# Patient Record
Sex: Male | Born: 1965 | Race: White | Hispanic: No | Marital: Married | Smoking: Never smoker
Health system: Southern US, Community
[De-identification: ages and names within clinical notes are randomized; demographics above are authoritative.]

## PROBLEM LIST (undated history)

## (undated) DIAGNOSIS — J329 Chronic sinusitis, unspecified: Secondary | ICD-10-CM

## (undated) DIAGNOSIS — R7303 Prediabetes: Secondary | ICD-10-CM

## (undated) DIAGNOSIS — M1990 Unspecified osteoarthritis, unspecified site: Secondary | ICD-10-CM

## (undated) DIAGNOSIS — M259 Joint disorder, unspecified: Secondary | ICD-10-CM

## (undated) DIAGNOSIS — G4730 Sleep apnea, unspecified: Secondary | ICD-10-CM

## (undated) DIAGNOSIS — C801 Malignant (primary) neoplasm, unspecified: Secondary | ICD-10-CM

## (undated) DIAGNOSIS — I10 Essential (primary) hypertension: Secondary | ICD-10-CM

## (undated) DIAGNOSIS — K219 Gastro-esophageal reflux disease without esophagitis: Secondary | ICD-10-CM

## (undated) DIAGNOSIS — E119 Type 2 diabetes mellitus without complications: Secondary | ICD-10-CM

## (undated) HISTORY — PX: WISDOM TOOTH EXTRACTION: SHX21

---

## 2007-07-09 ENCOUNTER — Ambulatory Visit: Admission: RE | Admit: 2007-07-09 | Discharge: 2007-07-09 | Payer: Self-pay | Admitting: Otolaryngology

## 2007-07-16 ENCOUNTER — Encounter: Admission: RE | Admit: 2007-07-16 | Discharge: 2007-07-16 | Payer: Self-pay | Admitting: Geriatric Medicine

## 2008-06-06 HISTORY — PX: BACK SURGERY: SHX140

## 2008-07-03 ENCOUNTER — Ambulatory Visit (HOSPITAL_COMMUNITY): Admission: RE | Admit: 2008-07-03 | Discharge: 2008-07-04 | Payer: Self-pay | Admitting: Neurosurgery

## 2010-11-19 NOTE — Op Note (Signed)
Darrell Whitehead, Darrell Whitehead                 ACCOUNT NO.:  [id number]   MEDICAL RECORD NO.:  [id number]          PATIENT TYPE:  OIB   LOCATION:  3528                         FACILITY:  [facility]   PHYSICIAN:  Donalee Citrin, M.D.        DATE OF BIRTH:  [birth year]   DATE OF PROCEDURE:  07/03/2008  DATE OF DISCHARGE:                               OPERATIVE REPORT   PREOPERATIVE DIAGNOSIS:  Left S1 radiculopathy from ruptured disk, L5-  S1, left.   PROCEDURES:  1. Lumbar laminectomy and microdiskectomy, L5-S1, left.  2. Microscope dissection of left S1 nerve root.  3. Microscopic diskectomy.   SURGEON:  Donalee Citrin, MD   ASSISTANT:  Reinaldo Meeker, MD   ANESTHESIA:  General endotracheal.   HISTORY OF PRESENT ILLNESS:  The patient is a very pleasant 45 year old  gentleman who has had progressive worsening back and left leg pain  radiating down to his left hip, down to back of his left leg, the  outside of his left foot with numbness, tingling in the same  distribution.  Preoperative imaging  showed a very large ruptured disk  causing severe thecal sac compression, left S1 nerve root compression.  The patient failed all forms of conservative treatment, had progressive  worsening pain and some trace weakness, some plantar flexion, was  recommended laminectomy and microdiskectomy.  Risks and benefits of the  operation were explained to the patient.  He understood and agreed to  proceed forth.   DESCRIPTION OF PROCEDURE:  The patient was brought to the OR, was  induced general anesthesia, positioned prone on the Wilson frame.  Back  was prepped in the usual sterile fashion.  Preop incision was localized  at the appropriate level at L5-S1, so after infiltration of 10 mL of  lidocaine with epi, a midline incision was made and Bovie electrocautery  was used to take down in the subperiosteum.  Dissection was carried down  in the lamina of L5 and S1 on the left.  Intraoperative x-ray confirmed  position  and appropriate level, so using a high-speed drill, the  inferior aspect of L5 medial facet complex and superior aspect of S1 was  drilled down using 2- and 3-mm Kerrison punch.  The laminotomy was begun  and the ligamentum flavum was removed in the piecemeal fashion exposing  the thecal sac and dorsal aspect of the S1 nerve root.  At this point,  operative microscope draped and brought into the field.  Under  microscopic illumination, the undersurface of the medial gutter was  underbitten to gain access to the lateral margin of the S1 nerve root.  The S1 pedicle was identified and using a 4 Penfield, the S1 nerve root  was dissected off.  A very large disk herniation partially calcified,  still contained with ligament at L5-S1.  D'Errico was used to reflect  the S1 nerve root medially.  Annulotomy was made with an 11 blade  scalpel.  Disk space was cleaned out.  Epstein curettes were used to  clean down and to remove the calcified portion, which was removed  in  piecemeal fashion with pituitary rongeurs.  At the end of diskectomy,  there was no further stenosis on the thecal sac or S1 nerve root, it was  explored with a coronary dilator, angled hockey stick and noted to be  widely patent.  The wound was then copiously irrigated.  Meticulous  hemostasis was maintained.  Gelfoam was overlaid on the top of dura.  The muscle and fascia reapproximated in layers with interrupted Vicryl  and the skin was closed with running 4-0 subcuticular.  Benzoin and  Steri-Strips were applied.  The patient went to recovery room in stable  condition.  At the end of the case, all needle count and sponge count  were correct.           ______________________________  Donalee Citrin, M.D.     GC/MEDQ  D:  07/03/2008  T:  07/04/2008  Job:  [id number]

## 2010-11-19 NOTE — Procedures (Signed)
NAMESAMIN, MILKE                 ACCOUNT NO.:  [id number]   MEDICAL RECORD NO.:  [id number]          PATIENT TYPE:  OUT   LOCATION:  SLEEP LAB                     FACILITY:  [facility]   PHYSICIAN:  Barbaraann Share, MD,FCCPDATE OF BIRTH:  01[birth year]   DATE OF STUDY:  07/09/2007                            NOCTURNAL POLYSOMNOGRAM   REFERRING PHYSICIAN:  Antony Contras, MD   INDICATION FOR STUDY:  Hypersomnia with sleep apnea.   EPWORTH SLEEPINESS SCORE:  18   SLEEP ARCHITECTURE:  The patient was found to have a total sleep time of  367 minutes, with very little slow-wave sleep and also decreased REM.  Sleep onset latency was mildly prolonged at 34 minutes, and REM latency  was normal.  Sleep efficiency was very poor during the diagnostic  portion of the study and excellent during the titration portion of the  study.   RESPIRATORY DATA:  The patient underwent a split night study, where he  was found to have 192 obstructive events in the first 107 minutes of  sleep.  This gave him an apnea/hypopnea index during the diagnostic  portion of 108 events per hour.  The events occurred in all body  positions, and there was loud snoring noted.  By protocol, the patient  was placed on a medium CPAP mask that was not recorded in the records by  the sleep technician.  CPAP was initiated at pressure of 5 centimeters  and ultimately titrated to 11 centimeters with good control of events,  even through REM.  Tolerance appeared to be excellent.   OXYGEN DATA:  The patient was found to have O2 desaturation transiently  to 80 percent with his obstructive events.   CARDIAC DATA:  No clinically significant arrhythmias were noted.   MOVEMENT-PARASOMNIA:  None.   IMPRESSIONS-RECOMMENDATIONS:  Split night study reveals very severe  obstructive sleep apnea with an apnea/hypopnea index of 108 events per  hour and O2 desaturation as low as 80% during the diagnostic portion.  The patient was placed on CPAP  with a medium-sized mask that was not  named in the records and ultimately titrated to  11 cm of water pressure, with very good response.  The patient should  also be encouraged to work aggressively on weight loss.      Barbaraann Share, MD,FCCP  Diplomate, American Board of Sleep  Medicine  Electronically Signed     [facility]/MEDQ  D:  07/22/2007 13:21:50  T:  07/22/2007 14:10:11  Job:  [id number]

## 2011-04-11 LAB — CBC
HCT: 50.5 % (ref 39.0–52.0)
Hemoglobin: 16.7 g/dL (ref 13.0–17.0)
MCHC: 33 g/dL (ref 30.0–36.0)
MCV: 91.3 fL (ref 78.0–100.0)
RBC: 5.53 MIL/uL (ref 4.22–5.81)
WBC: 12.4 10*3/uL — ABNORMAL HIGH (ref 4.0–10.5)

## 2011-07-23 ENCOUNTER — Other Ambulatory Visit: Payer: Self-pay | Admitting: Internal Medicine

## 2011-07-23 ENCOUNTER — Ambulatory Visit
Admission: RE | Admit: 2011-07-23 | Discharge: 2011-07-23 | Disposition: A | Discharge status: Home / Self Care | Payer: 59 | Source: Ambulatory Visit | Attending: Internal Medicine | Admitting: Internal Medicine

## 2011-07-23 DIAGNOSIS — M5410 Radiculopathy, site unspecified: Secondary | ICD-10-CM

## 2011-07-23 DIAGNOSIS — M545 Low back pain, unspecified: Secondary | ICD-10-CM

## 2011-07-23 MED ORDER — GADOBENATE DIMEGLUMINE 529 MG/ML IV SOLN
20.0000 mL | Freq: Once | INTRAVENOUS | Status: AC | PRN
  Administered 2011-07-23: 20 mL via INTRAVENOUS

## 2011-09-05 ENCOUNTER — Encounter (HOSPITAL_COMMUNITY): Payer: Self-pay | Admitting: Pharmacy Technician

## 2011-09-09 NOTE — Pre-Procedure Instructions (Signed)
20 Captain Blucher  09/09/2011   Your procedure is scheduled on: Wednesday September 17, 2011 at 1130 am.  Report to [facility] at 0930 AM.  Call this number if you have problems the morning of surgery: [phone]   Remember:   Do not eat food:After Midnight.  May have clear liquids: up to 4 Hours before arrival.  Clear liquids include soda, tea, black coffee, apple or grape juice, broth.  Take these medicines the morning of surgery with A SIP OF WATER: Hydrocodone if needed for pain   Do not wear jewelry, make-up or nail polish.  Do not wear lotions, powders, or perfumes. You may wear deodorant.  Do not shave 48 hours prior to surgery.  Do not bring valuables to the hospital.  Contacts, dentures or bridgework may not be worn into surgery.  Leave suitcase in the car. After surgery it may be brought to your room.  For patients admitted to the hospital, checkout time is 11:00 AM the day of discharge.   Patients discharged the day of surgery will not be allowed to drive home.  Name and phone number of your driver:   Special Instructions: CHG Shower Use Special Wash: 1/2 bottle night before surgery and 1/2 bottle morning of surgery.   Please read over the following fact sheets that you were given: Pain Booklet, Coughing and Deep Breathing and Surgical Site Infection Prevention

## 2011-09-10 ENCOUNTER — Encounter (HOSPITAL_COMMUNITY): Payer: Self-pay

## 2011-09-10 ENCOUNTER — Encounter (HOSPITAL_COMMUNITY)
Admission: RE | Admit: 2011-09-10 | Discharge: 2011-09-10 | Disposition: A | Discharge status: Home / Self Care | Payer: 59 | Source: Ambulatory Visit | Attending: Neurosurgery | Admitting: Neurosurgery

## 2011-09-10 HISTORY — DX: Joint disorder, unspecified: M25.9

## 2011-09-10 HISTORY — DX: Chronic sinusitis, unspecified: J32.9

## 2011-09-10 HISTORY — DX: Sleep apnea, unspecified: G47.30

## 2011-09-10 LAB — CBC
HCT: 43.5 % (ref 39.0–52.0)
MCH: 31.2 pg (ref 26.0–34.0)
MCHC: 35.2 g/dL (ref 30.0–36.0)
MCV: 88.6 fL (ref 78.0–100.0)
Platelets: 283 10*3/uL (ref 150–400)
RDW: 12.8 % (ref 11.5–15.5)

## 2011-09-10 NOTE — Progress Notes (Signed)
Pt denied having a Cardiologist, stress test, cardiac cath, or echocardiogram. Pt did have sleep study in 2009. Results in EPIC.

## 2011-09-16 MED ORDER — VANCOMYCIN HCL 1000 MG IV SOLR
1500.0000 mg | INTRAVENOUS | Status: AC
  Administered 2011-09-17: 1500 mg via INTRAVENOUS
  Filled 2011-09-16: qty 1500

## 2011-09-17 ENCOUNTER — Ambulatory Visit (HOSPITAL_COMMUNITY): Payer: 59 | Admitting: Anesthesiology

## 2011-09-17 ENCOUNTER — Encounter (HOSPITAL_COMMUNITY): Payer: Self-pay | Admitting: Anesthesiology

## 2011-09-17 ENCOUNTER — Encounter (HOSPITAL_COMMUNITY): Payer: Self-pay | Admitting: *Deleted

## 2011-09-17 ENCOUNTER — Ambulatory Visit (HOSPITAL_COMMUNITY)
Admission: RE | Admit: 2011-09-17 | Discharge: 2011-09-18 | Disposition: A | Discharge status: Home Health | Payer: 59 | Source: Ambulatory Visit | Attending: Neurosurgery | Admitting: Neurosurgery

## 2011-09-17 ENCOUNTER — Ambulatory Visit (HOSPITAL_COMMUNITY): Payer: 59

## 2011-09-17 ENCOUNTER — Encounter (HOSPITAL_COMMUNITY)
Admission: RE | Disposition: A | Discharge status: Home Health | Payer: Self-pay | Source: Ambulatory Visit | Attending: Neurosurgery

## 2011-09-17 DIAGNOSIS — G4730 Sleep apnea, unspecified: Secondary | ICD-10-CM | POA: Insufficient documentation

## 2011-09-17 DIAGNOSIS — M5126 Other intervertebral disc displacement, lumbar region: Secondary | ICD-10-CM | POA: Insufficient documentation

## 2011-09-17 DIAGNOSIS — Z01812 Encounter for preprocedural laboratory examination: Secondary | ICD-10-CM | POA: Insufficient documentation

## 2011-09-17 HISTORY — PX: LUMBAR LAMINECTOMY/DECOMPRESSION MICRODISCECTOMY: SHX5026

## 2011-09-17 SURGERY — LUMBAR LAMINECTOMY/DECOMPRESSION MICRODISCECTOMY 1 LEVEL
Anesthesia: General | Site: Back | Laterality: Left | Wound class: Clean

## 2011-09-17 MED ORDER — PROPOFOL 10 MG/ML IV EMUL
INTRAVENOUS | Status: DC | PRN
  Administered 2011-09-17: 30 mg via INTRAVENOUS
  Administered 2011-09-17: 200 mg via INTRAVENOUS

## 2011-09-17 MED ORDER — HYDROMORPHONE HCL PF 1 MG/ML IJ SOLN: 0.2500 mg | INTRAMUSCULAR | Status: DC | PRN

## 2011-09-17 MED ORDER — CYCLOBENZAPRINE HCL 10 MG PO TABS: 10.0000 mg | ORAL_TABLET | Freq: Three times a day (TID) | ORAL | Status: DC | PRN

## 2011-09-17 MED ORDER — BUPIVACAINE HCL (PF) 0.25 % IJ SOLN
INTRAMUSCULAR | Status: DC | PRN
  Administered 2011-09-17: 18 mL

## 2011-09-17 MED ORDER — LORATADINE 10 MG PO TABS
10.0000 mg | ORAL_TABLET | Freq: Every day | ORAL | Status: DC
  Administered 2011-09-17 – 2011-09-18 (×2): 10 mg via ORAL
  Filled 2011-09-17 (×2): qty 1

## 2011-09-17 MED ORDER — ONDANSETRON HCL 4 MG/2ML IJ SOLN: 4.0000 mg | Freq: Once | INTRAMUSCULAR | Status: DC | PRN

## 2011-09-17 MED ORDER — ROCURONIUM BROMIDE 100 MG/10ML IV SOLN
INTRAVENOUS | Status: DC | PRN
  Administered 2011-09-17: 10 mg via INTRAVENOUS
  Administered 2011-09-17: 50 mg via INTRAVENOUS

## 2011-09-17 MED ORDER — LIDOCAINE HCL (CARDIAC) 20 MG/ML IV SOLN
INTRAVENOUS | Status: DC | PRN
  Administered 2011-09-17: 60 mg via INTRAVENOUS

## 2011-09-17 MED ORDER — NEOSTIGMINE METHYLSULFATE 1 MG/ML IJ SOLN
INTRAMUSCULAR | Status: DC | PRN
  Administered 2011-09-17: 5 mg via INTRAVENOUS

## 2011-09-17 MED ORDER — SODIUM CHLORIDE 0.9 % IV SOLN
INTRAVENOUS | Status: AC
  Filled 2011-09-17: qty 500

## 2011-09-17 MED ORDER — MENTHOL 3 MG MT LOZG: 1.0000 | LOZENGE | OROMUCOSAL | Status: DC | PRN

## 2011-09-17 MED ORDER — KETOROLAC TROMETHAMINE 30 MG/ML IJ SOLN
INTRAMUSCULAR | Status: DC | PRN
  Administered 2011-09-17: 30 mg via INTRAVENOUS

## 2011-09-17 MED ORDER — ONDANSETRON HCL 4 MG/2ML IJ SOLN: 4.0000 mg | INTRAMUSCULAR | Status: DC | PRN

## 2011-09-17 MED ORDER — SUCCINYLCHOLINE CHLORIDE 20 MG/ML IJ SOLN
INTRAMUSCULAR | Status: DC | PRN
  Administered 2011-09-17: 200 mg via INTRAVENOUS

## 2011-09-17 MED ORDER — DIPHENHYDRAMINE-APAP (SLEEP) 25-500 MG PO TABS: 1.0000 | ORAL_TABLET | Freq: Every evening | ORAL | Status: DC | PRN

## 2011-09-17 MED ORDER — LIDOCAINE-EPINEPHRINE 1 %-1:100000 IJ SOLN
INTRAMUSCULAR | Status: DC | PRN
  Administered 2011-09-17: 10 mL via INTRADERMAL

## 2011-09-17 MED ORDER — SODIUM CHLORIDE 0.9 % IR SOLN
Status: DC | PRN
  Administered 2011-09-17: 13:00:00

## 2011-09-17 MED ORDER — DIPHENHYDRAMINE HCL 25 MG PO CAPS: 25.0000 mg | ORAL_CAPSULE | Freq: Every evening | ORAL | Status: DC | PRN

## 2011-09-17 MED ORDER — BACITRACIN 50000 UNITS IM SOLR
INTRAMUSCULAR | Status: AC
  Filled 2011-09-17: qty 1

## 2011-09-17 MED ORDER — ONDANSETRON HCL 4 MG/2ML IJ SOLN
INTRAMUSCULAR | Status: DC | PRN
  Administered 2011-09-17: 4 mg via INTRAVENOUS

## 2011-09-17 MED ORDER — DEXAMETHASONE SODIUM PHOSPHATE 10 MG/ML IJ SOLN
INTRAMUSCULAR | Status: AC
  Administered 2011-09-17: 10 mg via INTRAVENOUS
  Filled 2011-09-17: qty 1

## 2011-09-17 MED ORDER — DEXAMETHASONE SODIUM PHOSPHATE 10 MG/ML IJ SOLN: 10.0000 mg | Freq: Once | INTRAMUSCULAR | Status: DC

## 2011-09-17 MED ORDER — HYDROCODONE-ACETAMINOPHEN 10-325 MG PO TABS
1.0000 | ORAL_TABLET | Freq: Two times a day (BID) | ORAL | Status: DC
  Administered 2011-09-17 – 2011-09-18 (×2): 1 via ORAL
  Filled 2011-09-17 (×2): qty 1

## 2011-09-17 MED ORDER — ACETAMINOPHEN 650 MG RE SUPP: 650.0000 mg | RECTAL | Status: DC | PRN

## 2011-09-17 MED ORDER — PHENOL 1.4 % MT LIQD: 1.0000 | OROMUCOSAL | Status: DC | PRN

## 2011-09-17 MED ORDER — FENTANYL CITRATE 0.05 MG/ML IJ SOLN
INTRAMUSCULAR | Status: DC | PRN
  Administered 2011-09-17: 100 ug via INTRAVENOUS
  Administered 2011-09-17: 50 ug via INTRAVENOUS
  Administered 2011-09-17: 150 ug via INTRAVENOUS
  Administered 2011-09-17: 50 ug via INTRAVENOUS

## 2011-09-17 MED ORDER — ALUM & MAG HYDROXIDE-SIMETH 200-200-20 MG/5ML PO SUSP: 30.0000 mL | Freq: Four times a day (QID) | ORAL | Status: DC | PRN

## 2011-09-17 MED ORDER — OXYCODONE-ACETAMINOPHEN 5-325 MG PO TABS: 1.0000 | ORAL_TABLET | ORAL | Status: DC | PRN

## 2011-09-17 MED ORDER — SODIUM CHLORIDE 0.9 % IV SOLN
INTRAVENOUS | Status: DC | PRN
  Administered 2011-09-17: 12:00:00 via INTRAVENOUS

## 2011-09-17 MED ORDER — LACTATED RINGERS IV SOLN
INTRAVENOUS | Status: DC | PRN
  Administered 2011-09-17 (×2): via INTRAVENOUS

## 2011-09-17 MED ORDER — ACETAMINOPHEN 500 MG PO TABS: 500.0000 mg | ORAL_TABLET | Freq: Every evening | ORAL | Status: DC | PRN

## 2011-09-17 MED ORDER — HYDROMORPHONE HCL PF 1 MG/ML IJ SOLN: 0.5000 mg | INTRAMUSCULAR | Status: DC | PRN

## 2011-09-17 MED ORDER — VANCOMYCIN HCL IN DEXTROSE 1-5 GM/200ML-% IV SOLN
1000.0000 mg | Freq: Once | INTRAVENOUS | Status: AC
  Administered 2011-09-17: 1000 mg via INTRAVENOUS
  Filled 2011-09-17: qty 200

## 2011-09-17 MED ORDER — ACETAMINOPHEN 325 MG PO TABS: 650.0000 mg | ORAL_TABLET | ORAL | Status: DC | PRN

## 2011-09-17 MED ORDER — 0.9 % SODIUM CHLORIDE (POUR BTL) OPTIME
TOPICAL | Status: DC | PRN
  Administered 2011-09-17: 1000 mL

## 2011-09-17 MED ORDER — SODIUM CHLORIDE 0.9 % IJ SOLN
3.0000 mL | Freq: Two times a day (BID) | INTRAMUSCULAR | Status: DC
  Administered 2011-09-17: 3 mL via INTRAVENOUS

## 2011-09-17 MED ORDER — THROMBIN 5000 UNITS EX SOLR
CUTANEOUS | Status: DC | PRN
  Administered 2011-09-17 (×2): 5000 [IU] via TOPICAL

## 2011-09-17 MED ORDER — CEFAZOLIN SODIUM 1-5 GM-% IV SOLN: 1.0000 g | Freq: Three times a day (TID) | INTRAVENOUS | Status: DC

## 2011-09-17 MED ORDER — MIDAZOLAM HCL 5 MG/5ML IJ SOLN
INTRAMUSCULAR | Status: DC | PRN
  Administered 2011-09-17: 2 mg via INTRAVENOUS

## 2011-09-17 MED ORDER — HEMOSTATIC AGENTS (NO CHARGE) OPTIME
TOPICAL | Status: DC | PRN
  Administered 2011-09-17: 1 via TOPICAL

## 2011-09-17 MED ORDER — GLYCOPYRROLATE 0.2 MG/ML IJ SOLN
INTRAMUSCULAR | Status: DC | PRN
  Administered 2011-09-17: .8 mg via INTRAVENOUS

## 2011-09-17 SURGICAL SUPPLY — 58 items
ADH SKN CLS APL DERMABOND .7 (GAUZE/BANDAGES/DRESSINGS) ×1
APL SKNCLS STERI-STRIP NONHPOA (GAUZE/BANDAGES/DRESSINGS) ×1
BAG DECANTER FOR FLEXI CONT (MISCELLANEOUS) ×2 IMPLANT
BENZOIN TINCTURE PRP APPL 2/3 (GAUZE/BANDAGES/DRESSINGS) ×2 IMPLANT
BLADE SURG 11 STRL SS (BLADE) ×2 IMPLANT
BLADE SURG ROTATE 9660 (MISCELLANEOUS) IMPLANT
BRUSH SCRUB EZ PLAIN DRY (MISCELLANEOUS) ×2 IMPLANT
BUR MATCHSTICK NEURO 3.0 LAGG (BURR) ×2 IMPLANT
BUR PRECISION FLUTE 6.0 (BURR) ×2 IMPLANT
CANISTER SUCTION 2500CC (MISCELLANEOUS) ×2 IMPLANT
CLOTH BEACON ORANGE TIMEOUT ST (SAFETY) ×2 IMPLANT
CONT SPEC 4OZ CLIKSEAL STRL BL (MISCELLANEOUS) ×2 IMPLANT
DECANTER SPIKE VIAL GLASS SM (MISCELLANEOUS) ×2 IMPLANT
DERMABOND ADVANCED (GAUZE/BANDAGES/DRESSINGS) ×1
DERMABOND ADVANCED .7 DNX12 (GAUZE/BANDAGES/DRESSINGS) ×1 IMPLANT
DRAPE LAPAROTOMY 100X72X124 (DRAPES) ×2 IMPLANT
DRAPE MICROSCOPE LEICA (MISCELLANEOUS) ×1 IMPLANT
DRAPE POUCH INSTRU U-SHP 10X18 (DRAPES) ×2 IMPLANT
DRAPE PROXIMA HALF (DRAPES) IMPLANT
DRAPE SURG 17X23 STRL (DRAPES) ×2 IMPLANT
DRSG OPSITE 4X5.5 SM (GAUZE/BANDAGES/DRESSINGS) ×1 IMPLANT
ELECT REM PT RETURN 9FT ADLT (ELECTROSURGICAL) ×2
ELECTRODE REM PT RTRN 9FT ADLT (ELECTROSURGICAL) ×1 IMPLANT
GAUZE SPONGE 4X4 16PLY XRAY LF (GAUZE/BANDAGES/DRESSINGS) IMPLANT
GLOVE BIO SURGEON STRL SZ8 (GLOVE) ×2 IMPLANT
GLOVE BIOGEL PI IND STRL 7.5 (GLOVE) IMPLANT
GLOVE BIOGEL PI IND STRL 8.5 (GLOVE) IMPLANT
GLOVE BIOGEL PI INDICATOR 7.5 (GLOVE) ×1
GLOVE BIOGEL PI INDICATOR 8.5 (GLOVE) ×2
GLOVE ECLIPSE 7.5 STRL STRAW (GLOVE) ×2 IMPLANT
GLOVE EXAM NITRILE LRG STRL (GLOVE) IMPLANT
GLOVE EXAM NITRILE MD LF STRL (GLOVE) IMPLANT
GLOVE EXAM NITRILE XL STR (GLOVE) IMPLANT
GLOVE EXAM NITRILE XS STR PU (GLOVE) IMPLANT
GLOVE INDICATOR 8.5 STRL (GLOVE) ×2 IMPLANT
GLOVE SURG SS PI 8.0 STRL IVOR (GLOVE) ×3 IMPLANT
GOWN BRE IMP SLV AUR LG STRL (GOWN DISPOSABLE) ×1 IMPLANT
GOWN BRE IMP SLV AUR XL STRL (GOWN DISPOSABLE) ×6 IMPLANT
GOWN STRL REIN 2XL LVL4 (GOWN DISPOSABLE) ×2 IMPLANT
KIT BASIN OR (CUSTOM PROCEDURE TRAY) ×2 IMPLANT
KIT ROOM TURNOVER OR (KITS) ×2 IMPLANT
NDL SPNL 22GX3.5 QUINCKE BK (NEEDLE) ×1 IMPLANT
NEEDLE HYPO 22GX1.5 SAFETY (NEEDLE) ×2 IMPLANT
NEEDLE SPNL 22GX3.5 QUINCKE BK (NEEDLE) IMPLANT
NS IRRIG 1000ML POUR BTL (IV SOLUTION) ×2 IMPLANT
PACK LAMINECTOMY NEURO (CUSTOM PROCEDURE TRAY) ×2 IMPLANT
RUBBERBAND STERILE (MISCELLANEOUS) ×4 IMPLANT
SPONGE GAUZE 4X4 12PLY (GAUZE/BANDAGES/DRESSINGS) ×2 IMPLANT
SPONGE SURGIFOAM ABS GEL SZ50 (HEMOSTASIS) ×2 IMPLANT
STRIP CLOSURE SKIN 1/2X4 (GAUZE/BANDAGES/DRESSINGS) ×2 IMPLANT
SUT VIC AB 0 CT1 18XCR BRD8 (SUTURE) ×1 IMPLANT
SUT VIC AB 0 CT1 8-18 (SUTURE) ×2
SUT VIC AB 2-0 CT1 18 (SUTURE) ×2 IMPLANT
SUT VICRYL 4-0 PS2 18IN ABS (SUTURE) ×2 IMPLANT
SYR 20ML ECCENTRIC (SYRINGE) ×2 IMPLANT
TOWEL OR 17X24 6PK STRL BLUE (TOWEL DISPOSABLE) ×2 IMPLANT
TOWEL OR 17X26 10 PK STRL BLUE (TOWEL DISPOSABLE) ×2 IMPLANT
WATER STERILE IRR 1000ML POUR (IV SOLUTION) ×2 IMPLANT

## 2011-09-17 NOTE — Transfer of Care (Signed)
Immediate Anesthesia Transfer of Care Note  Patient: Darrell Whitehead  Procedure(s) Performed: Procedure(s) (LRB): LUMBAR LAMINECTOMY/DECOMPRESSION MICRODISCECTOMY 1 LEVEL (Left)  Patient Location: PACU  Anesthesia Type: General  Level of Consciousness: awake, alert  and oriented  Airway & Oxygen Therapy: Patient Spontanous Breathing and Patient connected to nasal cannula oxygen  Post-op Assessment: Report given to PACU RN, Post -op Vital signs reviewed and stable and Patient moving all extremities  Post vital signs: Reviewed and stable  Complications: No apparent anesthesia complications

## 2011-09-17 NOTE — Preoperative (Signed)
Beta Blockers   Reason not to administer Beta Blockers:Not Applicable 

## 2011-09-17 NOTE — Anesthesia Postprocedure Evaluation (Signed)
  Anesthesia Post-op Note  Patient: Darrell Whitehead  Procedure(s) Performed: Procedure(s) (LRB): LUMBAR LAMINECTOMY/DECOMPRESSION MICRODISCECTOMY 1 LEVEL (Left)  Patient Location: PACU  Anesthesia Type: General  Level of Consciousness: awake, alert  and oriented  Airway and Oxygen Therapy: Patient Spontanous Breathing and Patient connected to nasal cannula oxygen  Post-op Pain: mild  Post-op Assessment: Post-op Vital signs reviewed and Patient's Cardiovascular Status Stable  Post-op Vital Signs: stable  Complications: No apparent anesthesia complications

## 2011-09-17 NOTE — Anesthesia Preprocedure Evaluation (Addendum)
Anesthesia Evaluation  Patient identified by MRN, date of birth, ID band Patient awake and Patient confused    Reviewed: Allergy & Precautions, H&P , NPO status , Patient's Chart, lab work & pertinent test results  Airway Mallampati: I TM Distance: >3 FB Neck ROM: Full    Dental  (+) Teeth Intact   Pulmonary  breath sounds clear to auscultation        Cardiovascular Rhythm:Regular Rate:Normal     Neuro/Psych    GI/Hepatic   Endo/Other    Renal/GU      Musculoskeletal   Abdominal (+) + obese,  Abdomen: soft.    Peds  Hematology   Anesthesia Other Findings   Reproductive/Obstetrics                           Anesthesia Physical Anesthesia Plan  ASA: III  Anesthesia Plan: General   Post-op Pain Management:    Induction: Intravenous  Airway Management Planned: Oral ETT  Additional Equipment:   Intra-op Plan:   Post-operative Plan: Extubation in OR  Informed Consent: I have reviewed the patients History and Physical, chart, labs and discussed the procedure including the risks, benefits and alternatives for the proposed anesthesia with the patient or authorized representative who has indicated his/her understanding and acceptance.     Plan Discussed with:   Anesthesia Plan Comments: (Obesity Sleep apnea well controlled with CPAP HNP L5-S1  Plan GA with CPAP post-op  Kipp Brood MD)       Anesthesia Quick Evaluation

## 2011-09-17 NOTE — H&P (Signed)
Darrell Whitehead is an 46 y.o. male.   Chief Complaint: Back and left leg pain HPI: Patient was admitted and underwent previous laminectomy and discectomy many years ago who back in December had recurrence of his left leg pain and rays the back of his hamstrings and his calf into the bottom stenosis with numbness and tingling in the same distribution. Patient denies any right leg symptoms denies any bowel bladder complaints this is been refractory anti-inflammatories and exercise imaging revealed a large recurrent disc herniation L5-S1 left causing severe stenosis on thecal sac and left S1 nerve root. To this mixture treatment progression of clinical syndrome this was recommended laminectomy microscope was minutes of the operation with him he understands and agrees to proceed forward.  Past Medical History  Diagnosis Date  . Sleep apnea     C Pap at night  . Sinus infection     hx of  . Ankle disorder     Right ankle; wore cast; no surgery    Past Surgical History  Procedure Date  . Back surgery dec 2009    Family History  Problem Relation Age of Onset  . Anesthesia problems Neg Hx   . Hypotension Neg Hx   . Malignant hyperthermia Neg Hx   . Pseudochol deficiency Neg Hx    Social History:  reports that he has never smoked. He does not have any smokeless tobacco history on file. He reports that he drinks alcohol. He reports that he does not use illicit drugs.  Allergies:  Allergies  Allergen Reactions  . Penicillins Hives  . Tramadol Itching    Medications Prior to Admission  Medication Dose Route Frequency Provider Last Rate Last Dose  . dexamethasone (DECADRON) 10 MG/ML injection           . dexamethasone (DECADRON) injection 10 mg  10 mg Intravenous Once Mariam Dollar, MD      . HYDROmorphone (DILAUDID) injection 0.25-0.5 mg  0.25-0.5 mg Intravenous Q5 min PRN Kipp Brood, MD      . ondansetron [facility]) injection 4 mg  4 mg Intravenous Once PRN Kipp Brood, MD      . vancomycin  (VANCOCIN) 1,500 mg in sodium chloride 0.9 % 500 mL IVPB  1,500 mg Intravenous 60 min Pre-Op Mariam Dollar, MD       Medications Prior to Admission  Medication Sig Dispense Refill  . fexofenadine-pseudoephedrine (ALLEGRA-D 24) 180-240 MG per 24 hr tablet Take 1 tablet by mouth daily as needed.        No results found for this or any previous visit (from the past 48 hour(s)). No results found.  Review of Systems  Constitutional: Negative.   HENT: Positive for neck pain.   Eyes: Negative.   Respiratory: Negative.   Cardiovascular: Negative.   Gastrointestinal: Negative.   Genitourinary: Negative.   Musculoskeletal: Positive for back pain.  Skin: Negative.   Neurological: Positive for tingling.    Blood pressure 117/84, pulse 58, temperature 98.3 F (36.8 C), temperature source Oral, resp. rate 18, SpO2 98.00%. Physical Exam  Constitutional: He is oriented to person, place, and time. He appears well-developed.  HENT:  Head: Normocephalic.  Eyes: Pupils are equal, round, and reactive to light.  Respiratory: Breath sounds normal.  GI: Soft.  Neurological: He is alert and oriented to person, place, and time. He has normal strength. GCS eye subscore is 4. GCS verbal subscore is 5. GCS motor subscore is 6.  Reflex Scores:  Patellar reflexes are 1+ on the right side and 1+ on the left side.      Achilles reflexes are 1+ on the right side and 1+ on the left side.      Strength is 5 out of 5 in his iliopsoas, quads, and she's, gastrocs, EHL and anterior tibialis.     Assessment/Plan 45 and presents for redo left L5-S1 Limited microdiscectomy risks benefits of the operation were cemented patient as well as the perioperative course and expectations of outcome alternatives of surgery he understands and agrees to proceed forward.  Gayna Braddy P 09/17/2011, 11:46 AM

## 2011-09-17 NOTE — Op Note (Signed)
Preoperative diagnosis: Recurrent lumbar disc L5-S1 left the left S1 radiculopathy  Postoperative diagnosis: Same  Procedure: Redo lumbar laminectomy discectomy L5-S1 left with microdissection of left S1 nerve root microscopic discectomy  Surgeon: Jillyn Hidden Woodfin Kiss  Assistant: Shirlean Kelly  Anesthesia: Gen.  EBL: Minimal  History of present illness: Patient is very pleasant 46 year old gentleman presents with recurrent left S1 radiculopathy from her current rupture disc L5-S1 left. Patient had undergone a little energy microdiscectomy years ago initially did very well however starting in December start him recurrent left buttock and leg pain rate down the back of his leg to the outside bottom of his left foot. Workup revealed recurrent disc herniation L5-S1 left he failed all forms of conservative was recommended redo laminectomy microdiscectomy with her as well as of the operation as well as a perioperative course alternatives surgery and expectations of outcome he agreed to proceed forward.  Operative procedure: Patient brought into the or was induced under general anesthesia and positioned prone on the Wilson frame his back was prepped and draped in routine sterile fashion. His old incision was opened up and the scar tissues dissected free exposing the residual laminotomies at L5-S1. Interoperative X. identify the correct level then using a Penfield the scar tissues dissected off of the inferior aspect laminotomy defect and L5 medial facet complex  and superior aspect of the laminotomy defect at S1. Then using a 2 and 3 mm in punch laminotomy was extended further and L5 further laterally and further caudally to S1. The S1 pedicle was identified and marching from the S1 pedicle cephalad, scar tissue was dissected off of the large recurrent disc herniation still contained within scar and ligament. In addition there was a fairly sizable osteophyte coming off the medial facet complex displacing the  proximal and lateral S1 nerve root dorsally is all dissected off the S1 nerve root and removed in piecemeal fashion. Using a tree to 3 superficially retract the thecal sac working underneath the thecal sac and underneath the S1 nerve root this recurrent disc herniation was further dissected free and annulotomy was made again and the L5-S1 disc space and pituitary rongeurs used to go into the disc space. The nerve hook feeling up underneath the S1 nerve root on the medial thecal sac several large free (for medial express removed. It was partially calcified dislocation at the inferior endplate of L5 was bitten away with a safari movers and downgoing curettes. This space was further entered and cleaned out with Epstein curettes and pituitary rongeurs the undersurface of the thecal sac from the level of the interest of the L5 pedicle down the suppressant S1 pedicle results poor to confirm removal of all compressed fragments. The S1 foramen was explored with a coronary.her and hockey-stick and noted be widely patent the undersurface of thecal sac was explored with I nerve hook a 4 Penfield and also noted the decompressed. The wounds and to proceed her get meticulous hemostasis was maintained Gelfoam was laid over the dura the muscle fascia proximal in layers with interrupted Vicryl and the skin was closed running 4 subcuticular and Steri-Strips applied patient recovered in stable condition. At the end of case on sponge counts were correct.

## 2011-09-17 NOTE — Anesthesia Procedure Notes (Signed)
Procedure Name: Intubation Date/Time: 09/17/2011 12:21 PM Performed by: Julianne Rice K Pre-anesthesia Checklist: Emergency Drugs available, Patient identified, Timeout performed, Suction available and Patient being monitored Patient Re-evaluated:Patient Re-evaluated prior to inductionOxygen Delivery Method: Circle system utilized Preoxygenation: Pre-oxygenation with 100% oxygen Intubation Type: IV induction Ventilation: Mask ventilation without difficulty Laryngoscope Size: Mac and 4 Grade View: Grade II Tube type: Oral Number of attempts: 1 Airway Equipment and Method: Patient positioned with wedge pillow Placement Confirmation: ETT inserted through vocal cords under direct vision,  positive ETCO2 and breath sounds checked- equal and bilateral Secured at: 25 cm Tube secured with: Tape Dental Injury: Teeth and Oropharynx as per pre-operative assessment

## 2011-09-18 NOTE — Discharge Summary (Signed)
  Physician Discharge Summary  Patient ID: Darrell Whitehead MRN: [id number] DOB/AGE: 48-Sep-1967 48 y.o.  Admit date: 09/17/2011 Discharge date: 09/18/2011  Admission Diagnoses: Recurrent ruptured disc L4 L5-S1 left  Discharge Diagnoses: Recurrent ruptured disc L5-S1 left Active Problems:  * No active hospital problems. *    Discharged Condition: good  Hospital Course: Patient is good hospital underwent a redo laminectomy discectomy postoperatively well with recovered before the fluoroscope was in well and there were spontaneous he tolerated I will is clean and dry patient be discharged home.  Consults: Significant Diagnostic Studies: Treatments: Redo L5-S1 laminectomy discectomy left Discharge Exam: Blood pressure 129/70, pulse 80, temperature 98 F (36.7 C), temperature source Oral, resp. rate 20, SpO2 98.00%. Out of 5 wound clean and dry  Disposition: Home   Medication List  As of 09/18/2011  9:49 AM   TAKE these medications         diphenhydramine-acetaminophen 25-500 MG Tabs   Commonly known as: TYLENOL PM   Take 1 tablet by mouth at bedtime as needed. For sleep      fexofenadine-pseudoephedrine 180-240 MG per 24 hr tablet   Commonly known as: ALLEGRA-D 24   Take 1 tablet by mouth daily as needed.      HYDROcodone-acetaminophen 10-325 MG per tablet   Commonly known as: NORCO   Take 1 tablet by mouth 2 (two) times daily.      RASPBERRY PO   Take 1 capsule by mouth daily.           Follow-up Information    Follow up in 1 week.         Signed: Redina Zeller P 09/18/2011, 9:49 AM

## 2011-09-18 NOTE — Progress Notes (Signed)
Subjective: Patient reports Patient is a very well no leg pain some back soreness but well-controlled  Objective: Vital signs in last 24 hours: Temp:  [97.5 F (36.4 C)-98.3 F (36.8 C)] 98 F (36.7 C) (03/14 0800) Pulse Rate:  [80-102] 80  (03/14 0800) Resp:  [16-20] 20  (03/14 0800) BP: (117-153)/(64-74) 129/70 mmHg (03/14 0800) SpO2:  [93 %-98 %] 98 % (03/14 0800)  Intake/Output from previous day: 03/13 0701 - 03/14 0700 In: 2640 [P.O.:840; I.V.:1800] Out: 50 [Blood:50] Intake/Output this shift: Total I/O In: 480 [P.O.:480] Out: -   Strength 5 out of 5 wound clean and dry  Lab Results: No results found for this basename: WBC:2,HGB:2,HCT:2,PLT:2 in the last 72 hours BMET No results found for this basename: NA:2,K:2,CL:2,CO2:2,GLUCOSE:2,BUN:2,CREATININE:2,CALCIUM:2 in the last 72 hours  Studies/Results: Dg Lumbar Spine 1 View  09/17/2011  *RADIOLOGY REPORT*  Clinical Data: Left L5-S1 discectomy.  OPERATIVE LUMBAR SPINE - 1 VIEW 09/17/2011:  Comparison: MRI lumbar spine 07/23/2011 [facility] and lumbar spine x-rays 07/10/2011 [facility].  Findings: The same numbering scheme will be utilized as on the prior examinations, with L5 representing the last lumbar segment. Single image obtained at 1255 hours and submitted for interpretation post-operatively demonstrates the localizer probe directed toward the L5-S1 disc space.  IMPRESSION: L5-S1 localized intraoperatively.  Original Report Authenticated By: Arnell Sieving, M.D.    Assessment/Plan: Postoperative day 1 laminectomy going home  LOS: 1 day     Dorthey Depace P 09/18/2011, 9:48 AM

## 2011-09-18 NOTE — Discharge Instructions (Signed)
No lifting no bending no twisting no driving a riding a car. Wound Care Keep incision covered and dry for one week.  If you shower prior to then, cover incision with plastic wrap.  You may remove outer bandage after one week and shower.  Do not put any creams, lotions, or ointments on incision. Leave steri-strips on neck.  They will fall off by themselves. Activity Walk each and every day, increasing distance each day. No lifting greater than 5 lbs.  Avoid bending, arching, or twisting. No driving for 2 weeks; may ride as a passenger locally. If provided with back brace, wear when out of bed.  It is not necessary to wear in bed. Diet Resume your normal diet.  Return to Work Will be discussed at you follow up appointment. Call Your Doctor If Any of These Occur Redness, drainage, or swelling at the wound.  Temperature greater than 101 degrees. Severe pain not relieved by pain medication. Incision starts to come apart. Follow Up Appt Call today for appointment in 1-2 weeks ([phone]) or for problems.  If you have any hardware placed in your spine, you will need an x-ray before your appointment.

## 2011-09-22 ENCOUNTER — Encounter (HOSPITAL_COMMUNITY): Payer: Self-pay | Admitting: Neurosurgery

## 2015-06-13 ENCOUNTER — Other Ambulatory Visit: Payer: Self-pay | Admitting: Gastroenterology

## 2015-08-17 ENCOUNTER — Encounter (HOSPITAL_COMMUNITY): Payer: Self-pay | Admitting: *Deleted

## 2015-08-17 NOTE — Progress Notes (Signed)
08-17-15 1145 Spoke with pt per phone 08-16-15 states, "changed procedure to March 2017", reminded him to make sure the office is aware.

## 2015-08-27 ENCOUNTER — Encounter (HOSPITAL_COMMUNITY): Payer: Self-pay

## 2015-08-27 ENCOUNTER — Encounter (HOSPITAL_COMMUNITY)
Admission: RE | Disposition: A | Discharge status: Home / Self Care | Payer: Self-pay | Source: Ambulatory Visit | Attending: Gastroenterology

## 2015-08-27 ENCOUNTER — Ambulatory Visit (HOSPITAL_COMMUNITY)
Admission: RE | Admit: 2015-08-27 | Discharge: 2015-08-27 | Disposition: A | Discharge status: Home / Self Care | Payer: 59 | Source: Ambulatory Visit | Attending: Gastroenterology | Admitting: Gastroenterology

## 2015-08-27 ENCOUNTER — Ambulatory Visit (HOSPITAL_COMMUNITY): Payer: 59 | Admitting: Certified Registered"

## 2015-08-27 DIAGNOSIS — E119 Type 2 diabetes mellitus without complications: Secondary | ICD-10-CM | POA: Diagnosis not present

## 2015-08-27 DIAGNOSIS — Z1211 Encounter for screening for malignant neoplasm of colon: Secondary | ICD-10-CM | POA: Diagnosis not present

## 2015-08-27 DIAGNOSIS — G4733 Obstructive sleep apnea (adult) (pediatric): Secondary | ICD-10-CM | POA: Insufficient documentation

## 2015-08-27 DIAGNOSIS — G4730 Sleep apnea, unspecified: Secondary | ICD-10-CM | POA: Insufficient documentation

## 2015-08-27 DIAGNOSIS — Z6841 Body Mass Index (BMI) 40.0 and over, adult: Secondary | ICD-10-CM | POA: Insufficient documentation

## 2015-08-27 HISTORY — DX: Type 2 diabetes mellitus without complications: E11.9

## 2015-08-27 HISTORY — PX: COLONOSCOPY WITH PROPOFOL: SHX5780

## 2015-08-27 LAB — GLUCOSE, CAPILLARY: GLUCOSE-CAPILLARY: 126 mg/dL — AB (ref 65–99)

## 2015-08-27 SURGERY — COLONOSCOPY WITH PROPOFOL
Anesthesia: Monitor Anesthesia Care

## 2015-08-27 MED ORDER — SODIUM CHLORIDE 0.9 % IV SOLN: INTRAVENOUS | Status: DC

## 2015-08-27 MED ORDER — LIDOCAINE HCL (CARDIAC) 20 MG/ML IV SOLN
INTRAVENOUS | Status: DC | PRN
  Administered 2015-08-27: 60 mg via INTRAVENOUS

## 2015-08-27 MED ORDER — PROPOFOL 10 MG/ML IV BOLUS
INTRAVENOUS | Status: DC | PRN
  Administered 2015-08-27: 80 mg via INTRAVENOUS

## 2015-08-27 MED ORDER — MIDAZOLAM HCL 5 MG/5ML IJ SOLN
INTRAMUSCULAR | Status: DC | PRN
  Administered 2015-08-27: 2 mg via INTRAVENOUS

## 2015-08-27 MED ORDER — PROPOFOL 500 MG/50ML IV EMUL
INTRAVENOUS | Status: DC | PRN
  Administered 2015-08-27: 200 ug/kg/min via INTRAVENOUS

## 2015-08-27 MED ORDER — LACTATED RINGERS IV SOLN
INTRAVENOUS | Status: DC
  Administered 2015-08-27: 1000 mL via INTRAVENOUS

## 2015-08-27 MED ORDER — PROPOFOL 10 MG/ML IV BOLUS
INTRAVENOUS | Status: AC
  Filled 2015-08-27: qty 60

## 2015-08-27 MED ORDER — MIDAZOLAM HCL 2 MG/2ML IJ SOLN
INTRAMUSCULAR | Status: AC
  Filled 2015-08-27: qty 2

## 2015-08-27 MED ORDER — LIDOCAINE HCL (CARDIAC) 20 MG/ML IV SOLN
INTRAVENOUS | Status: AC
  Filled 2015-08-27: qty 5

## 2015-08-27 SURGICAL SUPPLY — 22 items

## 2015-08-27 NOTE — Op Note (Signed)
Procedure: Baseline screening colonoscopy  Endoscopist: Danise Edge  Premedication: Propofol administered by anesthesia  Procedure: The patient was placed in the left lateral decubitus position. Anal inspection and digital rectal exam were normal. Pentax pediatric colonoscope was introduced into the rectum and advanced to the cecum. A normal-appearing appendiceal orifice was identified. A normal-appearing ileocecal valve was intubated and the terminal ileum inspected. Colonic preparation for the exam today was good. Withdrawal time was 14 minutes  Rectum. Normal. Retroflexed view of the distal rectum was normal  Sigmoid colon and descending colon. Normal  Splenic flexure. Normal  Transverse colon. Normal  Hepatic flexure. Normal  Ascending colon. Normal  Cecum and ileocecal valve. Normal  Terminal ileum. Normal  Assessment: Normal baseline screening colonoscopy.  Recommendation: Schedule repeat screening colonoscopy in 10 years

## 2015-08-27 NOTE — Discharge Instructions (Signed)
Colonoscopy, Care After °Refer to this sheet in the next few weeks. These instructions provide you with information on caring for yourself after your procedure. Your health care provider may also give you more specific instructions. Your treatment has been planned according to current medical practices, but problems sometimes occur. Call your health care provider if you have any problems or questions after your procedure. °WHAT TO EXPECT AFTER THE PROCEDURE  °After your procedure, it is typical to have the following: °· A small amount of blood in your stool. °· Moderate amounts of gas and mild abdominal cramping or bloating. °HOME CARE INSTRUCTIONS °· Do not drive, operate machinery, or sign important documents for 24 hours. °· You may shower and resume your regular physical activities, but move at a slower pace for the first 24 hours. °· Take frequent rest periods for the first 24 hours. °· Walk around or put a warm pack on your abdomen to help reduce abdominal cramping and bloating. °· Drink enough fluids to keep your urine clear or pale yellow. °· You may resume your normal diet as instructed by your health care provider. Avoid heavy or fried foods that are hard to digest. °· Avoid drinking alcohol for 24 hours or as instructed by your health care provider. °· Only take over-the-counter or prescription medicines as directed by your health care provider. °· If a tissue sample (biopsy) was taken during your procedure: °¨ Do not take aspirin or blood thinners for 7 days, or as instructed by your health care provider. °¨ Do not drink alcohol for 7 days, or as instructed by your health care provider. °¨ Eat soft foods for the first 24 hours. °SEEK MEDICAL CARE IF: °You have persistent spotting of blood in your stool 2-3 days after the procedure. °SEEK IMMEDIATE MEDICAL CARE IF: °· You have more than a small spotting of blood in your stool. °· You pass large blood clots in your stool. °· Your abdomen is swollen  (distended). °· You have nausea or vomiting. °· You have a fever. °· You have increasing abdominal pain that is not relieved with medicine. °  °This information is not intended to replace advice given to you by your health care provider. Make sure you discuss any questions you have with your health care provider. °  °Document Released: 02/05/2004 Document Revised: 04/13/2013 Document Reviewed: 02/28/2013 °Elsevier Interactive Patient Education ©2016 [employer] ° °

## 2015-08-27 NOTE — Anesthesia Preprocedure Evaluation (Signed)
Anesthesia Evaluation  Patient identified by MRN, date of birth, ID band Patient awake    Reviewed: Allergy & Precautions, NPO status , Patient's Chart, lab work & pertinent test results  Airway Mallampati: II   Neck ROM: full    Dental   Pulmonary sleep apnea ,    breath sounds clear to auscultation       Cardiovascular negative cardio ROS   Rhythm:regular Rate:Normal     Neuro/Psych    GI/Hepatic   Endo/Other  diabetes, Type obesity  Renal/GU      Musculoskeletal   Abdominal   Peds  Hematology   Anesthesia Other Findings   Reproductive/Obstetrics                             Anesthesia Physical Anesthesia Plan  ASA: II  Anesthesia Plan: MAC   Post-op Pain Management:    Induction: Intravenous  Airway Management Planned: Simple Face Mask  Additional Equipment:   Intra-op Plan:   Post-operative Plan:   Informed Consent: I have reviewed the patients History and Physical, chart, labs and discussed the procedure including the risks, benefits and alternatives for the proposed anesthesia with the patient or authorized representative who has indicated his/her understanding and acceptance.     Plan Discussed with: CRNA, Anesthesiologist and Surgeon  Anesthesia Plan Comments:         Anesthesia Quick Evaluation

## 2015-08-27 NOTE — Transfer of Care (Signed)
Immediate Anesthesia Transfer of Care Note  Patient: Darrell Whitehead  Procedure(s) Performed: Procedure(s): COLONOSCOPY WITH PROPOFOL (N/A)  Patient Location: PACU  Anesthesia Type:MAC  Level of Consciousness:  sedated, patient cooperative and responds to stimulation  Airway & Oxygen Therapy:Patient Spontanous Breathing and Patient connected to face mask oxgen  Post-op Assessment:  Report given to PACU RN and Post -op Vital signs reviewed and stable  Post vital signs:  Reviewed and stable  Last Vitals:  Filed Vitals:   08/27/15 0638 08/27/15 0806  BP: 151/85 107/58  Pulse: 83 91  Temp: 37 C 37.1 C  Resp: 23 18    Complications: No apparent anesthesia complications

## 2015-08-27 NOTE — Anesthesia Postprocedure Evaluation (Signed)
Anesthesia Post Note  Patient: Darrell Whitehead  Procedure(s) Performed: Procedure(s) (LRB): COLONOSCOPY WITH PROPOFOL (N/A)  Patient location during evaluation: PACU Anesthesia Type: MAC Level of consciousness: awake and alert Pain management: pain level controlled Vital Signs Assessment: post-procedure vital signs reviewed and stable Respiratory status: spontaneous breathing, nonlabored ventilation, respiratory function stable and patient connected to nasal cannula oxygen Cardiovascular status: stable and blood pressure returned to baseline Anesthetic complications: no    Last Vitals:  Filed Vitals:   08/27/15 0638 08/27/15 0806  BP: 151/85 107/58  Pulse: 83 91  Temp: 37 C 37.1 C  Resp: 23 18    Last Pain: There were no vitals filed for this visit.               Nadege Carriger S

## 2015-08-27 NOTE — H&P (Signed)
  Procedure: Baseline screening colonoscopy  History: The patient is a 50 year old male born 1965/11/20. He is scheduled to undergo his first screening colonoscopy with polypectomy to prevent colon cancer.  Past medical history: Obstructive sleep apnea syndrome. Carpal tunnel syndrome. Allergic rhinitis.  Medication allergies: Penicillin  Exam: The patient is alert and lying comfortably on the endoscopy stretcher. Abdomen is soft and nontender to palpation. Lungs are clear to auscultation. Cardiac exam reveals a regular rhythm.  Plan: Proceed with baseline screening colonoscopy

## 2015-08-28 ENCOUNTER — Encounter (HOSPITAL_COMMUNITY): Payer: Self-pay | Admitting: Gastroenterology

## 2017-01-06 ENCOUNTER — Other Ambulatory Visit (HOSPITAL_COMMUNITY): Payer: Self-pay | Admitting: General Surgery

## 2017-01-22 ENCOUNTER — Encounter: Payer: 59 | Attending: General Surgery | Admitting: Registered"

## 2017-01-22 ENCOUNTER — Encounter: Payer: Self-pay | Admitting: Registered"

## 2017-01-22 DIAGNOSIS — Z713 Dietary counseling and surveillance: Secondary | ICD-10-CM | POA: Insufficient documentation

## 2017-01-22 DIAGNOSIS — E119 Type 2 diabetes mellitus without complications: Secondary | ICD-10-CM

## 2017-01-22 NOTE — Progress Notes (Signed)
Pre-Op Assessment Visit:  Pre-Operative Sleeve gastrectomy Surgery  Medical Nutrition Therapy:  Appt start time: 3:00  End time:  4:00  Patient was seen on 01/22/2017 for Pre-Operative Nutrition Assessment. Assessment [facility] on 01/22/2017.   Pt expectation of surgery: "feel better, have more energy"   Pt expectation of Dietitian: "provide a good idea of what kind of diet needed"  Start weight at [facility]: 329.8 BMI: 45.68   Pt arrives with wife. Pt states he lost 65 lbs with Atkins and 20-25 lbs with weight watchers. Pt reports he is currently following the ketogenic diet. Pt states he was  diagnosed with type 2 diabetes a year and a half ago.  Pt states he checks BS once/week: FBS (106-120).  Per insurance, pt needs 6 SWL visits prior to surgery.    24 hr Dietary Recall: First Meal: egg salad or grilled pork chop Snack: nuts or cheese Second Meal: fast food- non-fried meat, non-starchy vegetables Snack: nuts or celery Third Meal: parmesan pork chops, asparagus Snack: sugar-free jello with cool whip Beverages: water, diet soda, unsweet tea w/ stevia  Encouraged to engage in 150 minutes of moderate physical activity including cardiovascular and weight baring weekly  Handouts given during visit include:  . Pre-Op Goals . Bariatric Surgery Protein Shakes . Vitamin and Mineral Recommendations  During the appointment today the following Pre-Op Goals were reviewed with the patient: . Maintain or lose weight as instructed by your surgeon . Make healthy food choices . Begin to limit portion sizes . Limited concentrated sugars and fried foods . Keep fat/sugar in the single digits per serving on          food labels . Practice CHEWING your food  (aim for 30 chews per bite or until applesauce consistency) . Practice not drinking 15 minutes before, during, and 30 minutes after each meal/snack . Avoid  all carbonated beverages  . Avoid/limit caffeinated beverages  . Avoid all sugar-sweetened beverages . Consume 3 meals per day; eat every 3-5 hours . Make a list of non-food related activities . Aim for 64-100 ounces of FLUID daily  . Aim for at least 60-80 grams of PROTEIN daily . Look for a liquid protein source that contain ?15 g protein and ?5 g carbohydrate  (ex: shakes, drinks, shots) . Physical activity is an important part of a healthy lifestyle so keep it moving!  Follow diet recommendations listed below Energy and Macronutrient Recommendations: Calories: 1800 Carbohydrate: 200 Protein: 135 Fat: 50  Demonstrated degree of understanding via:  Teach Back   Teaching Method Utilized:  Visual Auditory Hands on  Barriers to learning/adherence to lifestyle change: none  Patient to call the Nutrition [facility] Diabetes Education Services to enroll in Pre-Op and Post-Op Nutrition Education when surgery date is scheduled.

## 2017-01-23 ENCOUNTER — Ambulatory Visit (HOSPITAL_COMMUNITY)
Admission: RE | Admit: 2017-01-23 | Discharge: 2017-01-23 | Disposition: A | Discharge status: Home / Self Care | Payer: 59 | Source: Ambulatory Visit | Attending: General Surgery | Admitting: General Surgery

## 2017-01-23 ENCOUNTER — Other Ambulatory Visit: Payer: Self-pay

## 2017-01-23 DIAGNOSIS — Z01818 Encounter for other preprocedural examination: Secondary | ICD-10-CM | POA: Diagnosis present

## 2017-01-23 DIAGNOSIS — R933 Abnormal findings on diagnostic imaging of other parts of digestive tract: Secondary | ICD-10-CM | POA: Insufficient documentation

## 2017-01-23 DIAGNOSIS — Z6841 Body Mass Index (BMI) 40.0 and over, adult: Secondary | ICD-10-CM | POA: Insufficient documentation

## 2017-02-18 ENCOUNTER — Encounter: Payer: Self-pay | Admitting: Skilled Nursing Facility1

## 2017-02-18 ENCOUNTER — Encounter: Payer: 59 | Attending: General Surgery | Admitting: Skilled Nursing Facility1

## 2017-02-18 DIAGNOSIS — E119 Type 2 diabetes mellitus without complications: Secondary | ICD-10-CM

## 2017-02-18 DIAGNOSIS — Z713 Dietary counseling and surveillance: Secondary | ICD-10-CM | POA: Diagnosis not present

## 2017-02-18 NOTE — Patient Instructions (Addendum)
-  Try .5-1 cup of starchy vegetable with lunch and dinner  -Try a  Slice of whole wheat toast with breakfast   -Keep working on chewing well and cutting out carbonated beverages   -You can half a glass of skim milk

## 2017-02-18 NOTE — Progress Notes (Signed)
  Assessment:   1st SWL Appointment. Pt needs 5 more SWL visits.   Pt states he gets up at 5am and not home until 8pm. Pt states 6 months ago he weighed 370 pounds. Pt states he does not check his blood sugar every day. Pt states when he does check it it is 95-115. Pt states when he has time he likes to go swimming. 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 Pt states he is on the keto diet.  Start weight at [facility]: 329.8 BMI: 45.68 Current Weight: 322.6   MEDICATIONS: See List   DIETARY INTAKE:  24-hr recall:  B ( AM): 2 eggs  Snk ( AM): nuts L ( PM): brussels sprouts and deviled eggs Snk ( PM):  D ( PM): grilled chicken and brussel sprouts  Snk ( PM):  Beverages: coffee with butter and oil, water   Usual physical activity: 1 mile walking every night  Diet to Follow: 1800 calories 200 g carbohydrates 135 g protein 50 g fat   Nutritional Diagnosis:  [place]-3.3 Overweight/obesity related to past poor dietary habits and physical inactivity as evidenced by patient w/ planned Sleeve Gastrectomy surgery following dietary guidelines for continued weight loss.    Intervention:  Nutrition counseling for upcoming Bariatric Surgery. Educated on proper diabetes control as it relates to the keto diet as well as the healthy viewpoint to have before surgery. Goals: -Encouraged to engage in 150 minutes of moderate physical activity including cardiovascular and weight baring weekly -Try .5-1 cup of starchy vegetable with lunch and dinner -Try a  Slice of whole wheat toast with breakfast  -Keep working on chewing well and cutting out carbonated beverages  -You can half a glass of skim milk  Teaching Method Utilized:  Visual Auditory Hands on  Barriers to learning/adherence to lifestyle change: none identified   Demonstrated degree of understanding via:  Teach Back   Monitoring/Evaluation:  Dietary intake, exercise,and body weight prn.

## 2017-03-19 ENCOUNTER — Encounter: Payer: 59 | Attending: General Surgery | Admitting: Registered"

## 2017-03-19 ENCOUNTER — Encounter: Payer: Self-pay | Admitting: Registered"

## 2017-03-19 DIAGNOSIS — E119 Type 2 diabetes mellitus without complications: Secondary | ICD-10-CM

## 2017-03-19 DIAGNOSIS — Z713 Dietary counseling and surveillance: Secondary | ICD-10-CM | POA: Insufficient documentation

## 2017-03-19 NOTE — Progress Notes (Signed)
Sleeve Gastrectomy  Appt start time: 5:00 end time: 5:25  Assessment: 2nd SWL Appointment.   Start Wt at [facility]:  329.8 Wt: 321.2 BMI: 44.48   Pt arrives having 1.4 lbs from previous visit. Pt states he has added skim milk to his routine in the morning to increase carbohydrate intake. Pt states he has been trying to drink more water, averaging 3-4 16.9 oz bottles/day. Pt states he is still working on chewing well and reducing carbonated beverages. 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 Pt states he drinks 1 can soda/day and working on decreasing intake. Pt states he is currently still trying to stick with keto diet as much as possible with adding in a few extra carbohydrates.    MEDICATIONS: See list   DIETARY INTAKE:  24-hr recall:  B ( AM): eggs, bacon, cheese, skim milk Snk ( AM): nuts  L ( PM): Harper's-brussel sprouts, deviled eggs Snk ( PM): none D ( PM): asparagus, grilled pork chops, sometimes peas Snk ( PM): none Beverages: skim milk, water, unsweetened tea, sugar-free soda  Usual physical activity: swimming, walking 1 mi, 4-5x/week  Diet to Follow: 1800 calories 200 g carbohydrates 135 g protein 50 g fat  Preferred Learning Style:   No preference indicated   Learning Readiness:   Ready  Change in progress     Nutritional Diagnosis:  [place]-3.3 Overweight/obesity related to past poor dietary habits and physical inactivity as evidenced by patient w/ planned sleeve gastrectomy surgery following dietary guidelines for continued weight loss.    Intervention:  Nutrition counseling for upcoming Bariatric Surgery.  Goals:  - Set schedule for working out, aiming for at least 30 min day, 3x/week. - Have a small piece of fruit and nuts as snack option in the afternoons.  - Reduce soda intake to 1 can every other  day.  - Check blood sugars in the morning before eating (<130) and 2 hours after meals (<180).  Teaching Method Utilized:  Visual Auditory  Handouts given during visit include:  none  Barriers to learning/adherence to lifestyle change: none  Demonstrated degree of understanding via:  Teach Back   Monitoring/Evaluation:  Dietary intake, exercise, and body weight in 1 month(s).

## 2017-03-19 NOTE — Patient Instructions (Addendum)
-   Set schedule for working out, aiming for at least 30 min day, 3x/week.  - Have a small piece of fruit and nuts as snack option in the afternoons.   - Reduce soda intake to 1 can every other day.   - Check blood sugars in the morning before eating (<130) and 2 hours after meals (<180).

## 2017-04-20 ENCOUNTER — Encounter: Payer: 59 | Attending: General Surgery | Admitting: Registered"

## 2017-04-20 ENCOUNTER — Encounter: Payer: Self-pay | Admitting: Registered"

## 2017-04-20 DIAGNOSIS — Z713 Dietary counseling and surveillance: Secondary | ICD-10-CM | POA: Diagnosis not present

## 2017-04-20 DIAGNOSIS — E119 Type 2 diabetes mellitus without complications: Secondary | ICD-10-CM

## 2017-04-20 NOTE — Patient Instructions (Addendum)
-   Decrease soda intake by drinking the smaller 7.5 oz cans.   - Aim to get 64 oz fluid a day. Keep a container or water bottle on you to sip throughout the day.   - Track food and fluid intake via MyFitnessPal or Baritastic App.

## 2017-04-20 NOTE — Progress Notes (Signed)
Sleeve Gastrectomy  Appt start time: 5:09 end time: 5:21  Assessment: 2nd SWL Appointment.   Start Wt at [facility]:  329.8 Wt: 315 BMI: 44.48   Pt arrives having lost 6 lbs from previous visit. Pt states he has increased physical activity by being more intentional about walking the dog daily. Pt states he is  aiming to get back into swimming at the [facility] for 45 min. Pt states he his snacks include blueberries or strawberries with nuts; helps satisfy him until next meal.  Pt states he is drinking 1 soda/day. Pt states he has been aiming to check BS 2x/day: FBS (90-93) and after meals (110-118). Pt states he is working on adding more carbohydrates into diet. Pt reports feeling better since adding in more carbohydrates.   Pt states he is currently still trying to stick with keto diet as much as possible with adding in a few extra carbohydrates.    MEDICATIONS: See list   DIETARY INTAKE:  24-hr recall:  B ( AM): eggs, bacon, cheese, skim milk Snk ( AM): nuts  L ( PM): smoked [place], collard greens, salad Snk ( PM): berries and nuts D ( PM): asparagus, grilled pork chops, sometimes peas Snk ( PM): none Beverages: skim milk, water, unsweetened tea, sugar-free soda (Coke zero or mello yello zero), coffee (stevia and creamer)  Usual physical activity: walking 1 mi, daily  Diet to Follow: 1800 calories 200 g carbohydrates 135 g protein 50 g fat  Preferred Learning Style:   No preference indicated   Learning Readiness:   Ready  Change in progress     Nutritional Diagnosis:  [place]-3.3 Overweight/obesity related to past poor dietary habits and physical inactivity as evidenced by patient w/ planned sleeve gastrectomy surgery following dietary guidelines for continued weight loss.    Intervention:  Nutrition counseling for upcoming Bariatric Surgery.  Goals:  - Decrease soda intake by drinking the smaller 7.5 oz cans.  - Aim to get 64 oz fluid a day. Keep a container or water bottle  on you to sip throughout the day.  - Track food and fluid intake via MyFitnessPal or Baritastic App.  Teaching Method Utilized:  Visual Auditory  Handouts given during visit include:  none  Barriers to learning/adherence to lifestyle change: none  Demonstrated degree of understanding via:  Teach Back   Monitoring/Evaluation:  Dietary intake, exercise, and body weight in 1 month(s).

## 2017-05-13 ENCOUNTER — Encounter: Payer: Self-pay | Admitting: Registered"

## 2017-05-13 ENCOUNTER — Encounter: Payer: 59 | Attending: General Surgery | Admitting: Registered"

## 2017-05-13 DIAGNOSIS — E119 Type 2 diabetes mellitus without complications: Secondary | ICD-10-CM

## 2017-05-13 DIAGNOSIS — Z713 Dietary counseling and surveillance: Secondary | ICD-10-CM | POA: Diagnosis not present

## 2017-05-13 NOTE — Progress Notes (Signed)
Sleeve Gastrectomy  Appt start time: 5:00 end time: 5:20  Assessment: 3rd SWL Appointment.   Start Wt at [facility]:  329.8 Wt: 314.4 BMI: 43.54   Pt arrives having maintained weight from previous visit. Pt has greatly reduced soda intake to only a few days since previous visit. Pt states he was tired and needed caffeine on days that he had soda. Pt states he has been aiming to check BS every 3 days: FBS (85-95) and after meals (120-133). Pt states he has been working a lot which has caused him to decrease amount of checking BS during the day. Pt states he swims when times allows, but lately work has been really busy.   Pt states he has increased physical activity by being more intentional about walking the dog daily. Pt states he is  aiming [facility] [employer] [facility] [employer] [facility].  Pt states he is currently still trying to stick with keto diet as much as possible with adding in a few extra carbohydrates.    MEDICATIONS: See list   DIETARY INTAKE:  24-hr recall:  B ( AM): eggs, bacon, cheese, skim milk Snk ( AM): nuts  L ( PM): smoked [place], collard greens, salad Snk ( PM): berries and nuts D ( PM): asparagus, grilled pork chops, sometimes peas Snk ( PM): none Beverages: skim milk, water, unsweetened tea, sugar-free soda (Coke zero or mello yello zero), coffee (stevia and sugar-free creamer), unsweet tea with stevia  Usual physical activity: walking 1 mi, daily; swimming when able   Diet to Follow: 1800 calories 200 g carbohydrates 135 g protein 50 g fat  Preferred Learning Style:   No preference indicated   Learning Readiness:   Ready  Change in progress     Nutritional Diagnosis:  [place]-3.3 Overweight/obesity related to past poor dietary habits and physical inactivity as evidenced by patient w/ planned sleeve gastrectomy surgery following dietary guidelines for continued weight loss.    Intervention:  Nutrition counseling for upcoming Bariatric  Surgery.  Goals:  - Continue to sip fluids throughout the day to get 64 ounces of fluid.  - Aim to track food and fluid intake via MyFitnessPal or Baritastic App. - Download Baritastic App. Set reminders for fluid intake.   Teaching Method Utilized:  Visual Auditory  Handouts given during visit include:  Baritastic App  Barriers to learning/adherence to lifestyle change: work-life balance  Demonstrated degree of understanding via:  Teach Back   Monitoring/Evaluation:  Dietary intake, exercise, and body weight in 1 month(s).

## 2017-05-13 NOTE — Patient Instructions (Addendum)
-   Continue to sip fluids throughout the day to get 64 ounces of fluid.   - Aim to track food and fluid intake via MyFitnessPal or Baritastic App.  - Download Baritastic App. Set reminders for fluid intake.

## 2017-06-08 ENCOUNTER — Encounter: Payer: 59 | Attending: General Surgery | Admitting: Registered"

## 2017-06-08 ENCOUNTER — Encounter: Payer: Self-pay | Admitting: Registered"

## 2017-06-08 DIAGNOSIS — E119 Type 2 diabetes mellitus without complications: Secondary | ICD-10-CM

## 2017-06-08 DIAGNOSIS — Z713 Dietary counseling and surveillance: Secondary | ICD-10-CM | POA: Insufficient documentation

## 2017-06-08 NOTE — Patient Instructions (Addendum)
-   Aim to not drink 15 minutes before eating, not while eating, waiting 30 minutes after eating.  - Keep up the great work!

## 2017-06-08 NOTE — Progress Notes (Signed)
Sleeve Gastrectomy  Appt start time: 5:05 end time: 5:23  Assessment: 4th SWL Appointment.   Start Wt at [facility]: 329.8 Wt: 313.7 BMI: 43.45   Pt arrives having maintained weight from previous visit. Pt states when he began keto diet in May he weighed 367 lbs. Pt states he has been doing well with drinking 64 ounces even while at work. Pt states he takes his water with him to meetings and sips throughout the day. Pt is doing well with listening to his body and stopping when sensing that he is full. Pt states he is experiencing sinus headaches due to weather changing; [facility]   Pt has greatly reduced soda intake to only a few days since previous visit. Pt states he was tired and needed caffeine on days that he had soda. Pt states he checks BS once a week: FBS (88-97) and at bedtime (120-135). Pt states he has been working a lot which has caused him to decrease amount of checking BS during the day. Pt states his wife stays on him to make sure he eats carbohydrates at each meal, although he is following keto diet. Pt states his BS was 67 once when he had to wait longer than normal to eat dinner due to having an appointment after work.  Pt states he swims when times allows, but lately work has been really busy. Pt states he is  [facility]  Pt states he is currently still trying to stick with keto diet as much as possible with adding in a few extra carbohydrates.    MEDICATIONS: See list   DIETARY INTAKE:  24-hr recall:  B ( AM): eggs, bacon, cheese, skim milk Snk ( AM): nuts  L ( PM): smoked [place], collard greens, salad Snk ( PM): berries and nuts D ( PM): asparagus, grilled pork chops, sometimes peas Snk ( PM): none Beverages: skim milk, water, unsweetened tea, sugar-free soda (Coke zero or mello yello zero), coffee (stevia and sugar-free creamer), unsweet tea with stevia  Usual physical activity: walking 1  mi, daily; swimming when able   Diet to Follow: 1800 calories 200 g carbohydrates 135 g protein 50 g fat  Preferred Learning Style:   No preference indicated   Learning Readiness:   Ready  Change in progress     Nutritional Diagnosis:  [place]-3.3 Overweight/obesity related to past poor dietary habits and physical inactivity as evidenced by patient w/ planned sleeve gastrectomy surgery following dietary guidelines for continued weight loss.    Intervention:  Nutrition counseling for upcoming Bariatric Surgery. Pt was encouraged to keep snacks handy to prevent going a long time without eating and experiencing low blood sugar.   Goals:  - Aim to not drink 15 minutes before eating, not while eating, waiting 30 minutes after eating. - Keep up the great work!   Teaching Method Utilized:  Visual Auditory  Handouts given during visit include:  none  Barriers to learning/adherence to lifestyle change: work-life balance  Demonstrated degree of understanding via:  Teach Back   Monitoring/Evaluation:  Dietary intake, exercise, and body weight in 1 month(s).

## 2017-07-09 ENCOUNTER — Encounter: Payer: 59 | Attending: General Surgery | Admitting: Registered"

## 2017-07-09 ENCOUNTER — Encounter: Payer: Self-pay | Admitting: Registered"

## 2017-07-09 DIAGNOSIS — Z713 Dietary counseling and surveillance: Secondary | ICD-10-CM | POA: Insufficient documentation

## 2017-07-09 DIAGNOSIS — E119 Type 2 diabetes mellitus without complications: Secondary | ICD-10-CM

## 2017-07-09 NOTE — Patient Instructions (Addendum)
-   Aim to decrease caffeine intake with coffee by adding in some decaf or reducing caffeinated coffee gradually.   - Get back on the habit of not drinking 15 minutes before eating, not while eating, and waiting 30 minutes after eating to drink.   - Aim to set alarms on phone as reminders to be consistent during non-work days to help with not drinking with meals.

## 2017-07-09 NOTE — Progress Notes (Signed)
Sleeve Gastrectomy  Appt start time: 5:00 end time: 5:20  Assessment: 6th SWL Appointment.   Start Wt at [facility]: 329.8 Wt: 313.0 BMI: 43.35   Pt arrives having maintained weight from previous visit. Pt states [facility]. Pt states he has eliminated soda intake altogether; drinks unsweetened tea, water, and coffee only.   Pt states when he began keto diet in May he weighed 367 lbs. Pt states he has been doing well with drinking 64 ounces even while at work.  Pt states he checks BS once a week: FBS (88-97) and at bedtime (120-135). Pt states he has been working a lot which has caused him to decrease amount of checking BS during the day. Pt states his wife stays on him to make sure he eats carbohydrates at each meal, although he is following keto diet.  Pt states he swims when time allows, but lately work has been really busy.   Pt states he is currently still trying to stick with keto diet as much as possible with adding in a few extra carbohydrates.    MEDICATIONS: See list   DIETARY INTAKE:  24-hr recall:  B ( AM): eggs, bacon, cheese, skim milk Snk ( AM): nuts  L ( PM): smoked [place]key, collard greens, salad Snk ( PM): berries and nuts D ( PM): asparagus, grilled pork chops, sometimes peas Snk ( PM): none Beverages: skim milk, water, unsweetened tea with stevia, coffee (stevia and sugar-free creamer)  Usual physical activity: walking 1 mi, daily; swimming when able   Diet to Follow: 1800 calories 200 g carbohydrates 135 g protein 50 g fat  Preferred Learning Style:   No preference indicated   Learning Readiness:   Ready  Change in progress     Nutritional Diagnosis:  -3.3 Overweight/obesity related to past poor dietary habits and physical inactivity as evidenced by patient w/ planned sleeve gastrectomy surgery following dietary guidelines for continued weight loss.    Intervention:  Nutrition counseling for upcoming  Bariatric Surgery. Pt was encouraged to keep snacks handy to prevent going a long time without eating and experiencing low blood sugar.   Goals:  - Aim to decrease caffeine intake with coffee by adding in some decaf or reducing caffeinated coffee gradually.  - Get back on the habit of not drinking 15 minutes before eating, not while eating, and waiting 30 minutes after eating to drink.  - Aim to set alarms on phone as reminders to be consistent during non-work days to help with not drinking with meals.   Teaching Method Utilized:  Visual Auditory  Handouts given during visit include:  none  Barriers to learning/adherence to lifestyle change: work-life balance  Demonstrated degree of understanding via:  Teach Back   Monitoring/Evaluation:  Dietary intake, exercise, and body weight prn.

## 2017-07-27 ENCOUNTER — Ambulatory Visit: Payer: 59

## 2018-06-28 ENCOUNTER — Other Ambulatory Visit: Payer: Self-pay | Admitting: Nephrology

## 2018-06-28 DIAGNOSIS — R809 Proteinuria, unspecified: Secondary | ICD-10-CM

## 2018-07-02 ENCOUNTER — Ambulatory Visit
Admission: RE | Admit: 2018-07-02 | Discharge: 2018-07-02 | Disposition: A | Discharge status: Home / Self Care | Payer: 59 | Source: Ambulatory Visit | Attending: Nephrology | Admitting: Nephrology

## 2018-07-02 DIAGNOSIS — R809 Proteinuria, unspecified: Secondary | ICD-10-CM

## 2019-02-17 IMAGING — RF DG UGI W/ KUB
10 of 14 series · 14 of 24 positions shown · non-contrast
Comparison: None.

CLINICAL DATA: Morbid obesity.

EXAM:
UPPER GI SERIES WITH KUB
TECHNIQUE: After obtaining a scout radiograph a routine upper GI series was
performed using thin density barium.
FLUOROSCOPY TIME:  Fluoroscopy Time:  2 minutes 0 seconds
Radiation Exposure Index (if provided by the fluoroscopic device):
184.7 mGy

[Series 1: t abdomen supine · 0.15mm/px · 1 of 1 slices shown]
[im 1/1]
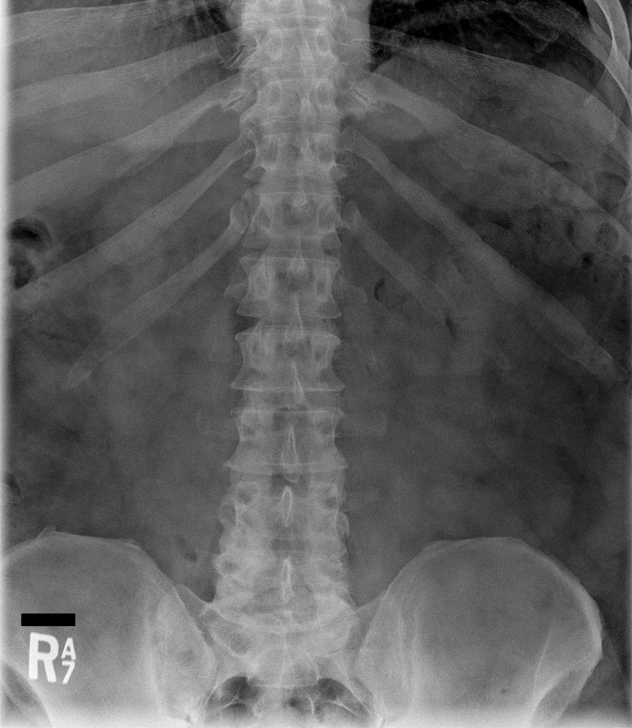

[Series 3: cp_standard · 0.52mm/px · 2 of 291 frames shown (1 of 4)]
[frame 44/291]
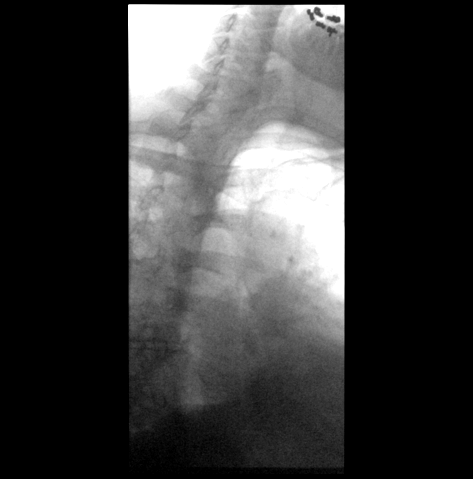
[frame 248/291]
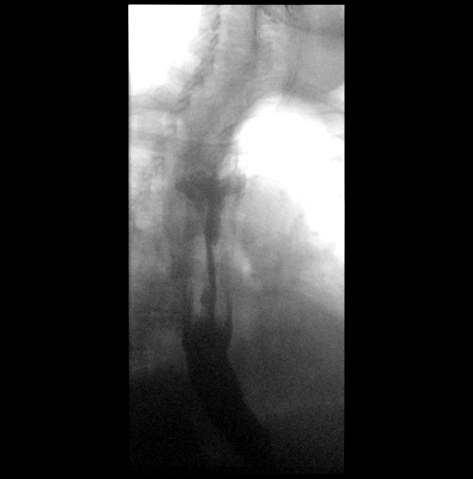

[Series 5: fluoro_barium 2fps_bw · 0.18mm/px · 2 of 2 frames shown (1 of 5)]
[frame 1/2]
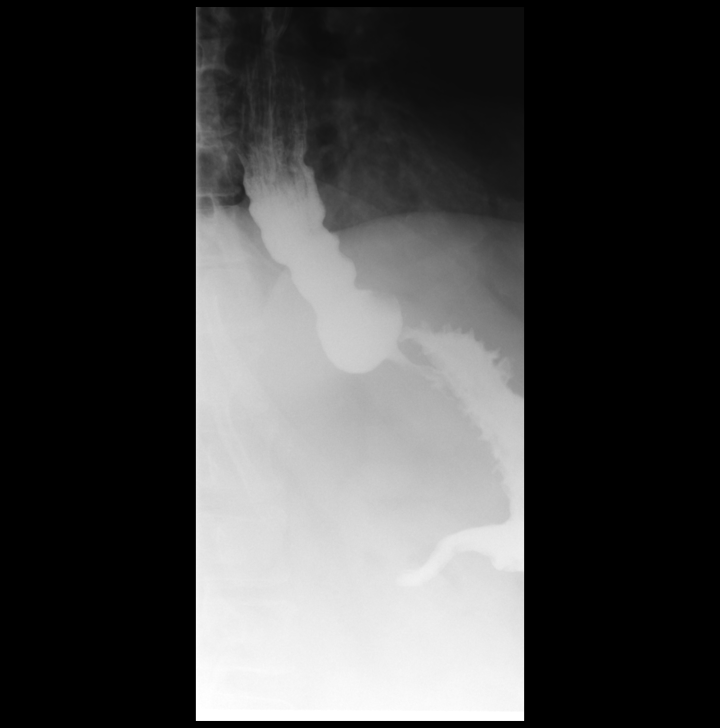
[frame 2/2]
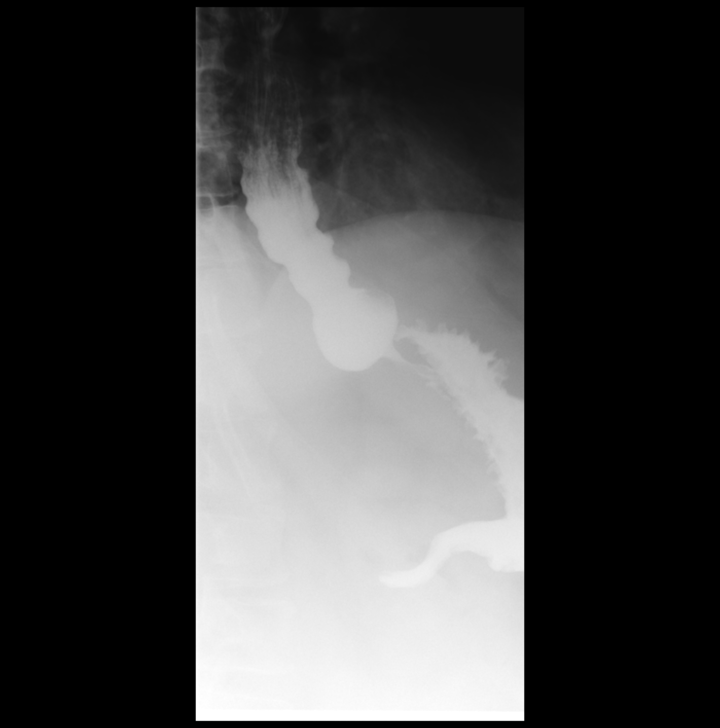

[Series 6: fluoro_barium 2fps_bw · 0.18mm/px · 1 of 3 frames shown (2 of 5)]
[frame 2/3]
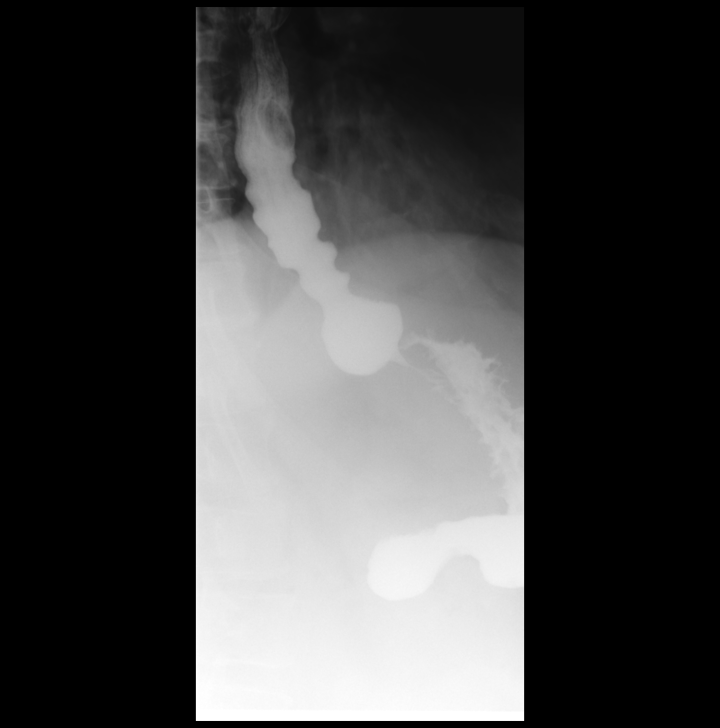

[Series 8: fluoro_barium 2fps_bw · 0.19mm/px · 1 of 1 slices shown (3 of 5)]
[im 1/1]
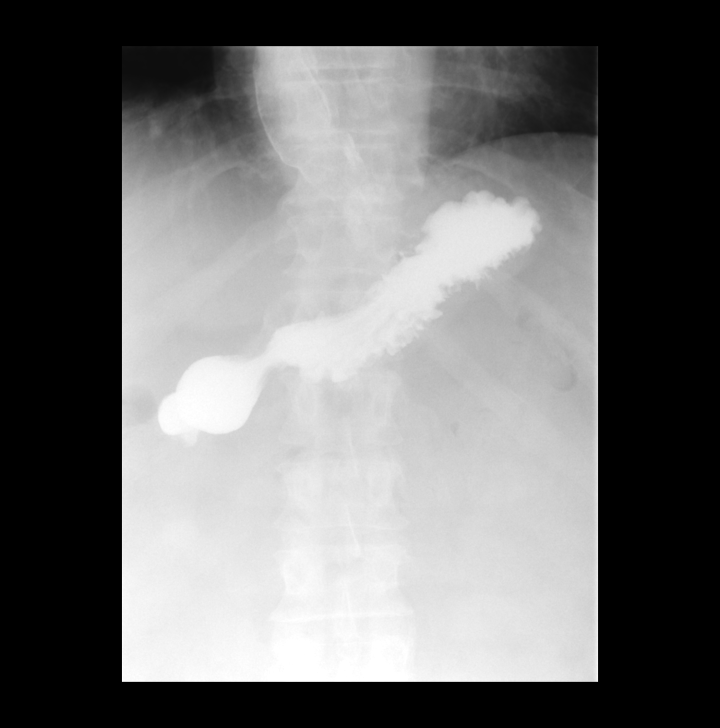

[Series 9: fluoro_barium 2fps_bw · 0.19mm/px · 1 of 1 slices shown (4 of 5)]
[im 1/1]
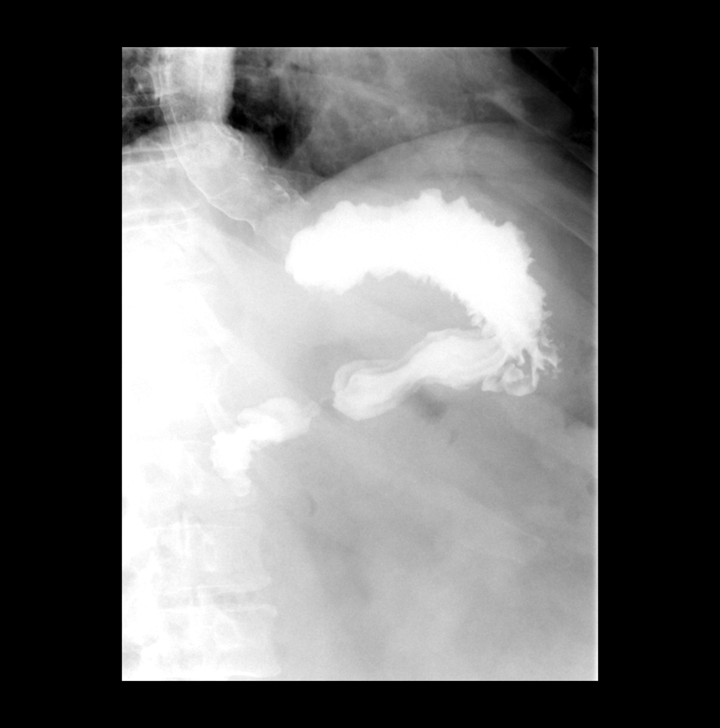

[Series 11: cp_standard · 0.57mm/px · 2 of 72 frames shown (2 of 4)]
[frame 37/72]
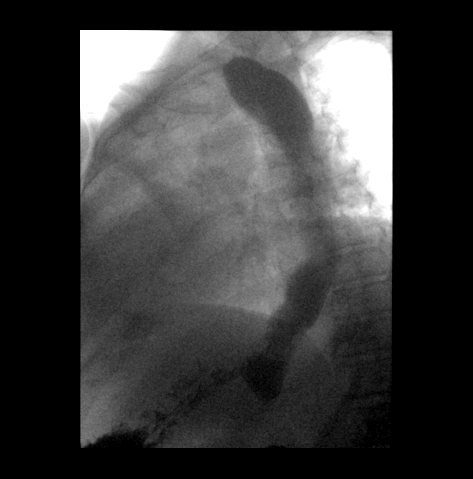
[frame 62/72]
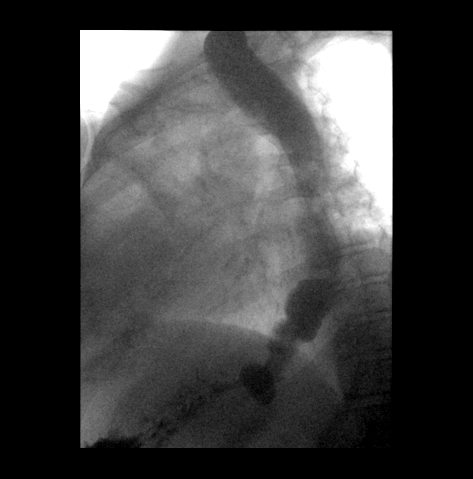

[Series 12: cp_standard · 0.57mm/px · 2 of 160 frames shown (3 of 4)]
[frame 125/160]
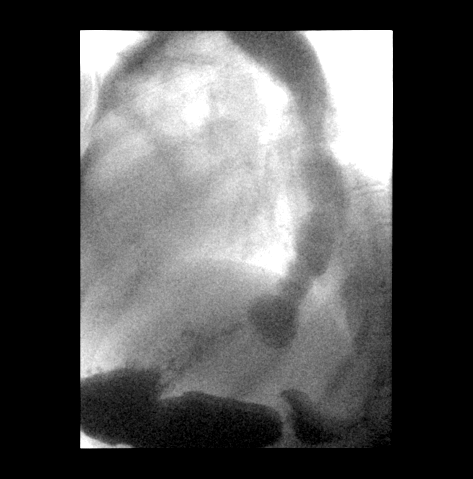
[frame 137/160]
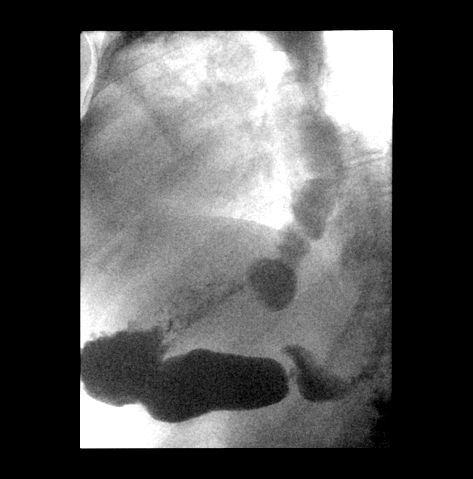

[Series 13: cp_standard · 0.57mm/px · 1 of 182 frames shown (4 of 4)]
[frame 28/182]
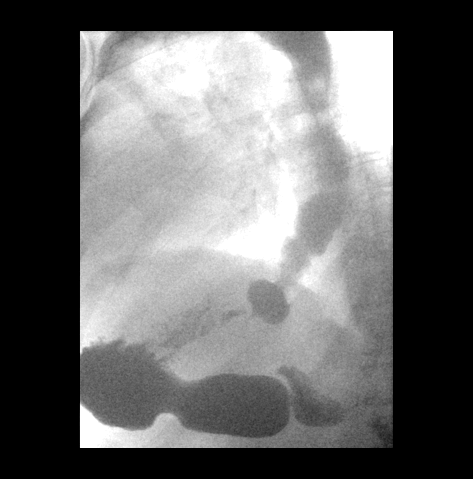

[Series 14: fluoro_barium 2fps_bw · 0.19mm/px · 1 of 1 slices shown (5 of 5)]
[im 1/1]
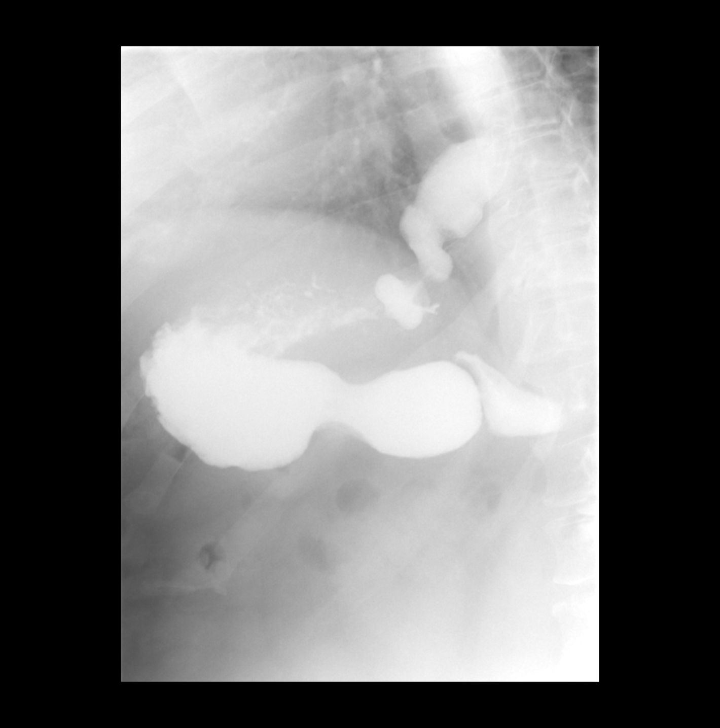

[14 of 24 positions shown; findings below may reference images not displayed]

FINDINGS: Normal bowel gas pattern.  No abnormal abdominal calcifications.

The patient has abnormal peristalsis of the esophagus with a
relatively flaccid appearance with no good primary or secondary
stripping waves. There are occasional slight tertiary contractions.
There is a "bird beak" appearance of the gastroesophageal junction
consistent with achalasia.

The fundus, body, and antrum of the stomach are normal. The pylorus
and duodenal bulb and C-loop appear normal.
IMPRESSION: 1. Findings consistent with achalasia.
2. Normal appearing stomach.

## 2019-09-13 ENCOUNTER — Other Ambulatory Visit: Payer: Self-pay | Admitting: Gastroenterology

## 2019-09-13 DIAGNOSIS — R7989 Other specified abnormal findings of blood chemistry: Secondary | ICD-10-CM

## 2019-09-13 DIAGNOSIS — R945 Abnormal results of liver function studies: Secondary | ICD-10-CM

## 2019-09-15 ENCOUNTER — Ambulatory Visit: Payer: 59 | Attending: Internal Medicine

## 2019-09-15 DIAGNOSIS — Z23 Encounter for immunization: Secondary | ICD-10-CM

## 2019-09-15 NOTE — Progress Notes (Signed)
   Covid-19 Vaccination Clinic  Name:  Yosiah Jasmin    MRN: [id number] DOB: 1965/11/04  09/15/2019  Mr. Floren was observed post Covid-19 immunization for 15 minutes without incident. He was provided with Vaccine Information Sheet and instruction to access the V-Safe system.   Mr. Crehan was instructed to call 911 with any severe reactions post vaccine: Marland Kitchen Difficulty breathing  . Swelling of face and throat  . A fast heartbeat  . A bad rash all over body  . Dizziness and weakness   Immunizations Administered    Name Date Dose VIS Date Route   Pfizer COVID-19 Vaccine 09/15/2019  3:34 PM 0.3 mL 06/17/2019 Intramuscular   Manufacturer: [employer], [employer]   Lot: [id number]   NDC: [address]-9

## 2019-09-20 ENCOUNTER — Ambulatory Visit
Admission: RE | Admit: 2019-09-20 | Discharge: 2019-09-20 | Disposition: A | Discharge status: Home / Self Care | Payer: 59 | Source: Ambulatory Visit | Attending: Gastroenterology | Admitting: Gastroenterology

## 2019-09-20 ENCOUNTER — Other Ambulatory Visit: Payer: Self-pay | Admitting: Gastroenterology

## 2019-09-20 DIAGNOSIS — R7989 Other specified abnormal findings of blood chemistry: Secondary | ICD-10-CM

## 2019-09-20 DIAGNOSIS — R9389 Abnormal findings on diagnostic imaging of other specified body structures: Secondary | ICD-10-CM

## 2019-09-20 DIAGNOSIS — R945 Abnormal results of liver function studies: Secondary | ICD-10-CM

## 2019-10-12 ENCOUNTER — Ambulatory Visit: Payer: 59 | Attending: Internal Medicine

## 2019-10-12 DIAGNOSIS — Z23 Encounter for immunization: Secondary | ICD-10-CM

## 2019-10-12 NOTE — Progress Notes (Signed)
   Covid-19 Vaccination Clinic  Name:  Micharl Helmes    MRN: [id number] DOB: February 09, 1966  10/12/2019  Mr. Derosia was observed post Covid-19 immunization for 15 minutes without incident. He was provided with Vaccine Information Sheet and instruction to access the V-Safe system.   Mr. Enis was instructed to call 911 with any severe reactions post vaccine: Marland Kitchen Difficulty breathing  . Swelling of face and throat  . A fast heartbeat  . A bad rash all over body  . Dizziness and weakness   Immunizations Administered    Name Date Dose VIS Date Route   Pfizer COVID-19 Vaccine 10/12/2019  8:08 AM 0.3 mL 06/17/2019 Intramuscular   Manufacturer: [employer], [employer]   Lot: [id number]   NDC: [address]-3

## 2019-10-18 ENCOUNTER — Ambulatory Visit
Admission: RE | Admit: 2019-10-18 | Discharge: 2019-10-18 | Disposition: A | Discharge status: Home / Self Care | Payer: 59 | Source: Ambulatory Visit | Attending: Gastroenterology | Admitting: Gastroenterology

## 2019-10-18 DIAGNOSIS — R9389 Abnormal findings on diagnostic imaging of other specified body structures: Secondary | ICD-10-CM

## 2019-10-18 DIAGNOSIS — R7989 Other specified abnormal findings of blood chemistry: Secondary | ICD-10-CM

## 2019-10-18 MED ORDER — GADOBENATE DIMEGLUMINE 529 MG/ML IV SOLN
20.0000 mL | Freq: Once | INTRAVENOUS | Status: AC | PRN
  Administered 2019-10-18: 17:00:00 20 mL via INTRAVENOUS

## 2020-06-06 ENCOUNTER — Ambulatory Visit: Payer: 59 | Attending: Internal Medicine

## 2020-06-06 ENCOUNTER — Other Ambulatory Visit (HOSPITAL_COMMUNITY): Payer: Self-pay | Admitting: Internal Medicine

## 2020-06-06 DIAGNOSIS — Z23 Encounter for immunization: Secondary | ICD-10-CM

## 2020-06-06 NOTE — Progress Notes (Signed)
   Covid-19 Vaccination Clinic  Name:  Leotis Isham    MRN: [id number] DOB: 04-14-1966  06/06/2020  Mr. Dockter was observed post Covid-19 immunization for 15 minutes without incident. He was provided with Vaccine Information Sheet and instruction to access the V-Safe system.   Mr. Schuenemann was instructed to call 911 with any severe reactions post vaccine: Marland Kitchen Difficulty breathing  . Swelling of face and throat  . A fast heartbeat  . A bad rash all over body  . Dizziness and weakness   Immunizations Administered    Name Date Dose VIS Date Route   Pfizer COVID-19 Vaccine 06/06/2020 11:17 AM 0.3 mL 04/25/2020 Intramuscular   Manufacturer: [employer], [employer]   Lot: [id number]   NDC: [address]-2

## 2020-09-03 ENCOUNTER — Other Ambulatory Visit: Payer: Self-pay | Admitting: Gastroenterology

## 2020-09-20 ENCOUNTER — Other Ambulatory Visit (HOSPITAL_COMMUNITY)
Admission: RE | Admit: 2020-09-20 | Discharge: 2020-09-20 | Disposition: A | Discharge status: Home / Self Care | Payer: 59 | Source: Ambulatory Visit | Attending: Gastroenterology | Admitting: Gastroenterology

## 2020-09-20 DIAGNOSIS — Z01812 Encounter for preprocedural laboratory examination: Secondary | ICD-10-CM | POA: Diagnosis present

## 2020-09-20 DIAGNOSIS — Z20822 Contact with and (suspected) exposure to covid-19: Secondary | ICD-10-CM | POA: Diagnosis not present

## 2020-09-20 LAB — SARS CORONAVIRUS 2 (TAT 6-24 HRS): SARS Coronavirus 2: NEGATIVE

## 2020-09-24 ENCOUNTER — Encounter (HOSPITAL_COMMUNITY)
Admission: RE | Disposition: A | Discharge status: Home / Self Care | Payer: Self-pay | Source: Ambulatory Visit | Attending: Gastroenterology

## 2020-09-24 ENCOUNTER — Ambulatory Visit (HOSPITAL_COMMUNITY)
Admission: RE | Admit: 2020-09-24 | Discharge: 2020-09-24 | Disposition: A | Discharge status: Home / Self Care | Payer: 59 | Source: Ambulatory Visit | Attending: Gastroenterology | Admitting: Gastroenterology

## 2020-09-24 ENCOUNTER — Encounter (HOSPITAL_COMMUNITY): Payer: Self-pay | Admitting: Gastroenterology

## 2020-09-24 DIAGNOSIS — K219 Gastro-esophageal reflux disease without esophagitis: Secondary | ICD-10-CM | POA: Insufficient documentation

## 2020-09-24 DIAGNOSIS — Z79899 Other long term (current) drug therapy: Secondary | ICD-10-CM | POA: Insufficient documentation

## 2020-09-24 DIAGNOSIS — K224 Dyskinesia of esophagus: Secondary | ICD-10-CM | POA: Diagnosis present

## 2020-09-24 DIAGNOSIS — R1319 Other dysphagia: Secondary | ICD-10-CM | POA: Diagnosis not present

## 2020-09-24 HISTORY — PX: ESOPHAGEAL MANOMETRY: SHX5429

## 2020-09-24 HISTORY — PX: 24 HOUR PH STUDY: SHX5419

## 2020-09-24 SURGERY — MANOMETRY, ESOPHAGUS

## 2020-09-24 MED ORDER — LIDOCAINE VISCOUS HCL 2 % MT SOLN
OROMUCOSAL | Status: AC
  Filled 2020-09-24: qty 15

## 2020-09-24 SURGICAL SUPPLY — 2 items
FACESHIELD LNG OPTICON STERILE (SAFETY) IMPLANT
GLOVE BIO SURGEON STRL SZ8 (GLOVE) ×4 IMPLANT

## 2020-09-24 NOTE — Progress Notes (Signed)
Esophageal manometry performed per protocol without complications.  Patient tolerated well. Ph probe placed per protocol without complications at 44 cm.  Education given on probe and diary to complete.  Patient verbalized understanding.  Patient to return tomorrow at 0845 am for probe removal.

## 2020-09-26 ENCOUNTER — Encounter (HOSPITAL_COMMUNITY): Payer: Self-pay | Admitting: Gastroenterology

## 2023-04-20 ENCOUNTER — Other Ambulatory Visit: Payer: Self-pay | Admitting: Urology

## 2023-05-21 NOTE — Progress Notes (Addendum)
COVID Vaccine received:  []  No [x]  Yes Date of any COVID positive Test in last 90 days: None  PCP - Eleanora Neighbor, MD at [facility] [phone] Cardiologist - none  Chest x-ray - 01-23-2017  2v  Epic EKG -  01-23-2017  Epic Stress Test -  ECHO -  Cardiac Cath -   PCR screen: []  Ordered & Completed []   No Order but Needs PROFEND     [x]   N/A for this surgery  Surgery Plan:  []  Ambulatory   [x]  Outpatient in bed  []  Admit Anesthesia:    [x]  General  []  Spinal  []   Choice []   MAC  Bowel Prep - []  No  [x]   Yes __Mag.citrate / fleet  Pacemaker / ICD device [x]  No []  Yes   Spinal Cord Stimulator:[x]  No []  Yes       History of Sleep Apnea? []  No [x]  Yes   CPAP used?- []  No [x]  Yes    Does the patient monitor blood sugar?   []  N/A   []  No [x]  Yes  Patient has: []  NO Hx DM   [x]  Pre-DM   []  DM1  []   DM2 Last A1c was:        on      Does patient have a [employer] or Dexacom? []  No []  Yes   Fasting Blood Sugar Ranges-  Checks Blood Sugar __2 times a Week  SGLT-2 inhibitors / usual dose - Farxiga 10mg  daily.  Hold x72 hours  last dose: Monday 05-25-2023 Diabetic medications/ instructions:  Metformin-  hold DOS  Blood Thinner / Instructions: none Aspirin Instructions:  none  ERAS Protocol Ordered: [x]  No  []  Yes Patient is to be NPO after:  midnight prio  Dental hx: []  Dentures:  [x]  N/A      []  Bridge or Partial:                   []  Loose or Damaged teeth:   Activity level: Patient is able to climb a flight of stairs without difficulty; [x]  No CP  [x]  No SOB.  Patient can ADLs without assistance.   Anesthesia review: Pre-DM, HTN, OSA-CPAP, GERD  Patient denies shortness of breath, fever, cough and chest pain at PAT appointment.  Patient verbalized understanding and agreement to the Pre-Surgical Instructions that were given to them at this PAT appointment. Patient was also educated of the need to review these PAT instructions again prior to his surgery.I  reviewed the appropriate phone numbers to call if they have any and questions or concerns.

## 2023-05-21 NOTE — Patient Instructions (Addendum)
SURGICAL WAITING ROOM VISITATION Patients having surgery or a procedure may have no more than 2 support people in the waiting area - these visitors may rotate in the visitor waiting room.   Due to an increase in RSV and influenza rates and associated hospitalizations, children ages 7 and under may not visit patients in [facility] hospitals. If the patient needs to stay at the hospital during part of their recovery, the visitor guidelines for inpatient rooms apply.  PRE-OP VISITATION  Pre-op nurse will coordinate an appropriate time for 1 support person to accompany the patient in pre-op.  This support person may not rotate.  This visitor will be contacted when the time is appropriate for the visitor to come back in the pre-op area.  Please refer to the [facility] website for the visitor guidelines for Inpatients (after your surgery is over and you are in a regular room).  You are not required to quarantine at this time prior to your surgery. However, you must do this: Hand Hygiene often Do NOT share personal items Notify your provider if you are in close contact with someone who has COVID or you develop fever 100.4 or greater, new onset of sneezing, cough, sore throat, shortness of breath or body aches.  If you test positive for Covid or have been in contact with anyone that has tested positive in the last 10 days please notify you surgeon.    Your procedure is scheduled on:  Friday  May 29, 2023  Report to [facility] Main Entrance: [address] entrance where the [employer] is available.   Report to admitting at: 05:15    AM  Call this number if you have any questions or problems the morning of surgery [phone]  DO NOT EAT OR DRINK ANYTHING AFTER MIDNIGHT THE NIGHT PRIOR TO YOUR SURGERY / PROCEDURE.   FOLLOW  ANY ADDITIONAL PRE OP INSTRUCTIONS YOU RECEIVED FROM YOUR SURGEON'S OFFICE!!!  MAGNESIUM CITRATE:  Obtain one (1)  bottle (10 oz) of Magnesium Citrate at your  pharmacy. Drink entire bottle at 12:00 noon the day before your surgery/ procedure.  FLEET ENEMA: Obtain one(1) Fleet Enema (sodium phosphate 7-19 gm / 118 ml enema) and use (according to the directions on the box) the night prior to your surgery.   If you have any questions, please contact your Surgeon's office for additional information.     Oral Hygiene is also important to reduce your risk of infection.        Remember - BRUSH YOUR TEETH THE MORNING OF SURGERY WITH YOUR REGULAR TOOTHPASTE  Do NOT smoke after Midnight the night before surgery.  STOP TAKING all Vitamins, Herbs and supplements 1 week before your surgery.   Diabetic Medication: Metformin-  Day before surgery: take your usual dose                   DAY OF SURGERY:  DO NOT TAKE METFORMIN  Marcelline Deist-  Stop taking your Farxiga 72 hours prior to your surgery. Last dose    will be taken on Monday 05-25-2023  Take ONLY these medicines the morning of surgery with A SIP OF WATER: Pantoprazole (Protonix)  You may use your Flonase nasal spray if needed.   If You have been diagnosed with Sleep Apnea - Bring CPAP mask and tubing day of surgery. We will provide you with a CPAP machine on the day of your surgery.  You may not have any metal on your body including  jewelry, and body piercing  Do not wear  lotions, powders, cologne, or deodorant  Men may shave face and neck.  Contacts, Hearing Aids, dentures or bridgework may not be worn into surgery. DENTURES WILL BE REMOVED PRIOR TO SURGERY PLEASE DO NOT APPLY "Poly grip" OR ADHESIVES!!!  You may bring a small overnight bag with you on the day of surgery, only pack items that are not valuable. [facility] IS NOT RESPONSIBLE   FOR VALUABLES THAT ARE LOST OR STOLEN.    Do not bring your home medications to the hospital. The Pharmacy will dispense medications listed on your medication list to you during your admission in the Hospital.  Special Instructions: Bring a  copy of your healthcare power of attorney and living will documents the day of surgery, if you wish to have them scanned into your [facility] Records- EPIC  Please read over the following fact sheets you were given: IF YOU HAVE QUESTIONS ABOUT YOUR PRE-OP INSTRUCTIONS, PLEASE CALL [phone].   [facility] - Preparing for Surgery Before surgery, you can play an important role.  Because skin is not sterile, your skin needs to be as free of germs as possible.  You can reduce the number of germs on your skin by washing with CHG (chlorahexidine gluconate) soap before surgery.  CHG is an antiseptic cleaner which kills germs and bonds with the skin to continue killing germs even after washing. Please DO NOT use if you have an allergy to CHG or antibacterial soaps.  If your skin becomes reddened/irritated stop using the CHG and inform your nurse when you arrive at Short Stay. Do not shave (including legs and underarms) for at least 48 hours prior to the first CHG shower.  You may shave your face/neck.  Please follow these instructions carefully:  1.  Shower with CHG Soap the night before surgery and the  morning of surgery.  2.  If you choose to wash your hair, wash your hair first as usual with your normal  shampoo.  3.  After you shampoo, rinse your hair and body thoroughly to remove the shampoo.                             4.  Use CHG as you would any other liquid soap.  You can apply chg directly to the skin and wash.  Gently with a scrungie or clean washcloth.  5.  Apply the CHG Soap to your body ONLY FROM THE NECK DOWN.   Do not use on face/ open                           Wound or open sores. Avoid contact with eyes, ears mouth and genitals (private parts).                       Wash face,  Genitals (private parts) with your normal soap.             6.  Wash thoroughly, paying special attention to the area where your  surgery  will be performed.  7.  Thoroughly rinse your body with warm  water from the neck down.  8.  DO NOT shower/wash with your normal soap after using and rinsing off the CHG Soap.  9.  Pat yourself dry with a clean towel.            10.  Wear clean pajamas.            11.  Place clean sheets on your bed the night of your first shower and do not  sleep with pets.  ON THE DAY OF SURGERY : Do not apply any lotions/deodorants the morning of surgery.  Please wear clean clothes to the hospital/surgery center.    FAILURE TO FOLLOW THESE INSTRUCTIONS MAY RESULT IN THE CANCELLATION OF YOUR SURGERY  PATIENT SIGNATURE_________________________________  NURSE SIGNATURE__________________________________  ________________________________________________________________________

## 2023-05-22 ENCOUNTER — Encounter (HOSPITAL_COMMUNITY): Payer: Self-pay

## 2023-05-22 ENCOUNTER — Encounter (HOSPITAL_COMMUNITY)
Admission: RE | Admit: 2023-05-22 | Discharge: 2023-05-22 | Disposition: A | Discharge status: Home / Self Care | Payer: 59 | Source: Ambulatory Visit | Attending: Urology | Admitting: Urology

## 2023-05-22 ENCOUNTER — Other Ambulatory Visit: Payer: Self-pay

## 2023-05-22 VITALS — BP 124/75 | HR 70 | Temp 98.8°F | Resp 20 | Ht 72.0 in | Wt 301.0 lb

## 2023-05-22 DIAGNOSIS — R7303 Prediabetes: Secondary | ICD-10-CM | POA: Diagnosis not present

## 2023-05-22 DIAGNOSIS — Z01818 Encounter for other preprocedural examination: Secondary | ICD-10-CM | POA: Diagnosis not present

## 2023-05-22 DIAGNOSIS — Z01812 Encounter for preprocedural laboratory examination: Secondary | ICD-10-CM | POA: Diagnosis present

## 2023-05-22 HISTORY — DX: Unspecified osteoarthritis, unspecified site: M19.90

## 2023-05-22 HISTORY — DX: Prediabetes: R73.03

## 2023-05-22 HISTORY — DX: Malignant (primary) neoplasm, unspecified: C80.1

## 2023-05-22 HISTORY — DX: Gastro-esophageal reflux disease without esophagitis: K21.9

## 2023-05-22 HISTORY — DX: Essential (primary) hypertension: I10

## 2023-05-22 LAB — CBC
HCT: 46 % (ref 39.0–52.0)
Hemoglobin: 15.3 g/dL (ref 13.0–17.0)
MCH: 30.6 pg (ref 26.0–34.0)
MCHC: 33.3 g/dL (ref 30.0–36.0)
MCV: 92 fL (ref 80.0–100.0)
Platelets: 289 10*3/uL (ref 150–400)
RBC: 5 MIL/uL (ref 4.22–5.81)
RDW: 13.2 % (ref 11.5–15.5)
WBC: 7.8 10*3/uL (ref 4.0–10.5)
nRBC: 0 % (ref 0.0–0.2)

## 2023-05-22 LAB — BASIC METABOLIC PANEL
Anion gap: 7 (ref 5–15)
BUN: 18 mg/dL (ref 6–20)
CO2: 23 mmol/L (ref 22–32)
Calcium: 9 mg/dL (ref 8.9–10.3)
Chloride: 107 mmol/L (ref 98–111)
Creatinine, Ser: 0.85 mg/dL (ref 0.61–1.24)
GFR, Estimated: >60 mL/min (ref >60)
Glucose, Bld: 101 mg/dL — ABNORMAL HIGH (ref 70–99)
Potassium: 3.9 mmol/L (ref 3.5–5.1)
Sodium: 137 mmol/L (ref 135–145)

## 2023-05-28 NOTE — Anesthesia Preprocedure Evaluation (Addendum)
Anesthesia Evaluation  Patient identified by MRN, date of birth, ID band Patient awake    Reviewed: Allergy & Precautions, NPO status , Patient's Chart, lab work & pertinent test results  History of Anesthesia Complications Negative for: history of anesthetic complications  Airway Mallampati: IV  TM Distance: >3 FB Neck ROM: Full    Dental no notable dental hx.    Pulmonary neg pulmonary ROS   Pulmonary exam normal        Cardiovascular hypertension, Pt. on medications Normal cardiovascular exam     Neuro/Psych negative neurological ROS     GI/Hepatic Neg liver ROS,GERD  Medicated,,  Endo/Other  diabetes, Type 2, Oral Hypoglycemic Agents  Class 3 obesity  Renal/GU negative Renal ROS  negative genitourinary   Musculoskeletal  (+) Arthritis ,    Abdominal   Peds  Hematology negative hematology ROS (+)   Anesthesia Other Findings Day of surgery medications reviewed with patient.  Reproductive/Obstetrics                              Anesthesia Physical Anesthesia Plan  ASA: 3  Anesthesia Plan: General   Post-op Pain Management: Tylenol PO (pre-op)*, Dilaudid IV and Ketamine IV*   Induction: Intravenous  PONV Risk Score and Plan: 3 and Treatment may vary due to age or medical condition, Midazolam, Dexamethasone and Ondansetron  Airway Management Planned: Oral ETT  Additional Equipment: None  Intra-op Plan:   Post-operative Plan: Extubation in OR  Informed Consent: I have reviewed the patients History and Physical, chart, labs and discussed the procedure including the risks, benefits and alternatives for the proposed anesthesia with the patient or authorized representative who has indicated his/her understanding and acceptance.     Dental advisory given  Plan Discussed with: CRNA  Anesthesia Plan Comments:          Anesthesia Quick Evaluation

## 2023-05-29 ENCOUNTER — Other Ambulatory Visit: Payer: Self-pay

## 2023-05-29 ENCOUNTER — Ambulatory Visit (HOSPITAL_COMMUNITY): Payer: 59 | Admitting: Anesthesiology

## 2023-05-29 ENCOUNTER — Inpatient Hospital Stay (HOSPITAL_COMMUNITY)
Admission: RE | Admit: 2023-05-29 | Discharge: 2023-06-02 | DRG: 707 | Disposition: A | Discharge status: Home / Self Care | Payer: 59 | Attending: Urology | Admitting: Urology

## 2023-05-29 ENCOUNTER — Encounter (HOSPITAL_COMMUNITY)
Admission: RE | Disposition: A | Discharge status: Home / Self Care | Payer: Self-pay | Source: Home / Self Care | Attending: Urology

## 2023-05-29 ENCOUNTER — Encounter (HOSPITAL_COMMUNITY): Payer: Self-pay | Admitting: Urology

## 2023-05-29 DIAGNOSIS — Z7984 Long term (current) use of oral hypoglycemic drugs: Secondary | ICD-10-CM

## 2023-05-29 DIAGNOSIS — N3289 Other specified disorders of bladder: Secondary | ICD-10-CM | POA: Diagnosis present

## 2023-05-29 DIAGNOSIS — E119 Type 2 diabetes mellitus without complications: Secondary | ICD-10-CM | POA: Diagnosis present

## 2023-05-29 DIAGNOSIS — N179 Acute kidney failure, unspecified: Secondary | ICD-10-CM | POA: Diagnosis present

## 2023-05-29 DIAGNOSIS — C61 Malignant neoplasm of prostate: Secondary | ICD-10-CM | POA: Diagnosis not present

## 2023-05-29 DIAGNOSIS — Z8249 Family history of ischemic heart disease and other diseases of the circulatory system: Secondary | ICD-10-CM

## 2023-05-29 DIAGNOSIS — Z833 Family history of diabetes mellitus: Secondary | ICD-10-CM

## 2023-05-29 DIAGNOSIS — Z1152 Encounter for screening for COVID-19: Secondary | ICD-10-CM

## 2023-05-29 DIAGNOSIS — K219 Gastro-esophageal reflux disease without esophagitis: Secondary | ICD-10-CM | POA: Diagnosis present

## 2023-05-29 DIAGNOSIS — N134 Hydroureter: Secondary | ICD-10-CM | POA: Diagnosis present

## 2023-05-29 DIAGNOSIS — Z6841 Body Mass Index (BMI) 40.0 and over, adult: Secondary | ICD-10-CM

## 2023-05-29 DIAGNOSIS — N401 Enlarged prostate with lower urinary tract symptoms: Secondary | ICD-10-CM | POA: Diagnosis present

## 2023-05-29 DIAGNOSIS — E66813 Obesity, class 3: Secondary | ICD-10-CM | POA: Diagnosis present

## 2023-05-29 DIAGNOSIS — R351 Nocturia: Secondary | ICD-10-CM | POA: Diagnosis present

## 2023-05-29 DIAGNOSIS — I10 Essential (primary) hypertension: Secondary | ICD-10-CM | POA: Diagnosis present

## 2023-05-29 DIAGNOSIS — R3912 Poor urinary stream: Secondary | ICD-10-CM | POA: Diagnosis present

## 2023-05-29 DIAGNOSIS — E7800 Pure hypercholesterolemia, unspecified: Secondary | ICD-10-CM | POA: Diagnosis present

## 2023-05-29 HISTORY — PX: ROBOT ASSISTED LAPAROSCOPIC RADICAL PROSTATECTOMY: SHX5141

## 2023-05-29 HISTORY — PX: LYMPHADENECTOMY: SHX5960

## 2023-05-29 LAB — TYPE AND SCREEN
ABO/RH(D): O POS
Antibody Screen: NEGATIVE

## 2023-05-29 LAB — GLUCOSE, CAPILLARY
Glucose-Capillary: 175 mg/dL — ABNORMAL HIGH (ref 70–99)
Glucose-Capillary: 207 mg/dL — ABNORMAL HIGH (ref 70–99)

## 2023-05-29 LAB — HEMOGLOBIN AND HEMATOCRIT, BLOOD
HCT: 46.8 % (ref 39.0–52.0)
Hemoglobin: 15.7 g/dL (ref 13.0–17.0)

## 2023-05-29 LAB — ABO/RH: ABO/RH(D): O POS

## 2023-05-29 SURGERY — XI ROBOTIC ASSISTED LAPAROSCOPIC RADICAL PROSTATECTOMY LEVEL 1
Anesthesia: General

## 2023-05-29 MED ORDER — ROCURONIUM BROMIDE 10 MG/ML (PF) SYRINGE
PREFILLED_SYRINGE | INTRAVENOUS | Status: AC
  Filled 2023-05-29: qty 10

## 2023-05-29 MED ORDER — FLUORESCEIN SODIUM 10 % IV SOLN
INTRAVENOUS | Status: DC | PRN
  Administered 2023-05-29: .2 mg via INTRAVENOUS

## 2023-05-29 MED ORDER — FLEET ENEMA RE ENEM: 1.0000 | ENEMA | Freq: Once | RECTAL | Status: DC

## 2023-05-29 MED ORDER — PANTOPRAZOLE SODIUM 40 MG PO TBEC
40.0000 mg | DELAYED_RELEASE_TABLET | Freq: Every day | ORAL | Status: DC
  Administered 2023-05-30 – 2023-06-02 (×4): 40 mg via ORAL
  Filled 2023-05-29 (×4): qty 1

## 2023-05-29 MED ORDER — PROPOFOL 10 MG/ML IV BOLUS
INTRAVENOUS | Status: DC | PRN
  Administered 2023-05-29: 200 mg via INTRAVENOUS

## 2023-05-29 MED ORDER — KETAMINE HCL 50 MG/5ML IJ SOSY
PREFILLED_SYRINGE | INTRAMUSCULAR | Status: AC
  Filled 2023-05-29: qty 5

## 2023-05-29 MED ORDER — DOCUSATE SODIUM 100 MG PO CAPS: 100.0000 mg | ORAL_CAPSULE | Freq: Two times a day (BID) | ORAL | Status: AC

## 2023-05-29 MED ORDER — MIDAZOLAM HCL 2 MG/2ML IJ SOLN
INTRAMUSCULAR | Status: AC
Start: 2023-05-29 — End: ?
  Filled 2023-05-29: qty 2

## 2023-05-29 MED ORDER — DEXAMETHASONE SODIUM PHOSPHATE 10 MG/ML IJ SOLN
INTRAMUSCULAR | Status: DC | PRN
  Administered 2023-05-29: 5 mg via INTRAVENOUS

## 2023-05-29 MED ORDER — OXYCODONE HCL 5 MG PO TABS
5.0000 mg | ORAL_TABLET | ORAL | Status: DC | PRN
  Administered 2023-05-29 – 2023-06-02 (×13): 5 mg via ORAL
  Filled 2023-05-29 (×13): qty 1

## 2023-05-29 MED ORDER — TRIPLE ANTIBIOTIC 3.5-400-5000 EX OINT: 1.0000 | TOPICAL_OINTMENT | Freq: Three times a day (TID) | CUTANEOUS | Status: DC | PRN

## 2023-05-29 MED ORDER — DIPHENHYDRAMINE HCL 12.5 MG/5ML PO ELIX: 12.5000 mg | ORAL_SOLUTION | Freq: Four times a day (QID) | ORAL | Status: DC | PRN

## 2023-05-29 MED ORDER — FENTANYL CITRATE (PF) 100 MCG/2ML IJ SOLN
INTRAMUSCULAR | Status: AC
  Filled 2023-05-29: qty 2

## 2023-05-29 MED ORDER — HYDROMORPHONE HCL 2 MG/ML IJ SOLN
INTRAMUSCULAR | Status: AC
  Filled 2023-05-29: qty 1

## 2023-05-29 MED ORDER — SUGAMMADEX SODIUM 200 MG/2ML IV SOLN
INTRAVENOUS | Status: DC | PRN
  Administered 2023-05-29: 550 mg via INTRAVENOUS

## 2023-05-29 MED ORDER — DROPERIDOL 2.5 MG/ML IJ SOLN: 0.6250 mg | Freq: Once | INTRAMUSCULAR | Status: DC | PRN

## 2023-05-29 MED ORDER — SODIUM CHLORIDE (PF) 0.9 % IJ SOLN
INTRAMUSCULAR | Status: AC
  Filled 2023-05-29: qty 20

## 2023-05-29 MED ORDER — INSULIN ASPART 100 UNIT/ML IJ SOLN
0.0000 [IU] | Freq: Three times a day (TID) | INTRAMUSCULAR | Status: DC
  Administered 2023-05-29: 3 [IU] via SUBCUTANEOUS
  Administered 2023-05-30 (×2): 2 [IU] via SUBCUTANEOUS
  Administered 2023-05-30 – 2023-05-31 (×4): 3 [IU] via SUBCUTANEOUS
  Administered 2023-06-01: 2 [IU] via SUBCUTANEOUS

## 2023-05-29 MED ORDER — ONDANSETRON HCL 4 MG/2ML IJ SOLN
INTRAMUSCULAR | Status: AC
  Filled 2023-05-29: qty 2

## 2023-05-29 MED ORDER — ORAL CARE MOUTH RINSE: 15.0000 mL | Freq: Once | OROMUCOSAL | Status: AC

## 2023-05-29 MED ORDER — CHLORHEXIDINE GLUCONATE 0.12 % MT SOLN
15.0000 mL | Freq: Once | OROMUCOSAL | Status: AC
Start: 2023-05-29 — End: 2023-05-29
  Administered 2023-05-29: 15 mL via OROMUCOSAL

## 2023-05-29 MED ORDER — ORAL CARE MOUTH RINSE: 15.0000 mL | OROMUCOSAL | Status: DC | PRN

## 2023-05-29 MED ORDER — ACETAMINOPHEN 500 MG PO TABS
1000.0000 mg | ORAL_TABLET | Freq: Four times a day (QID) | ORAL | Status: AC
Start: 2023-05-29 — End: 2023-05-30
  Administered 2023-05-29 – 2023-05-30 (×4): 1000 mg via ORAL
  Filled 2023-05-29 (×4): qty 2

## 2023-05-29 MED ORDER — ONDANSETRON HCL 4 MG/2ML IJ SOLN: 4.0000 mg | INTRAMUSCULAR | Status: DC | PRN

## 2023-05-29 MED ORDER — KETAMINE HCL 50 MG/5ML IJ SOSY
PREFILLED_SYRINGE | INTRAMUSCULAR | Status: DC | PRN
  Administered 2023-05-29: 30 mg via INTRAVENOUS
  Administered 2023-05-29 (×2): 10 mg via INTRAVENOUS

## 2023-05-29 MED ORDER — BUPIVACAINE LIPOSOME 1.3 % IJ SUSP
INTRAMUSCULAR | Status: DC | PRN
  Administered 2023-05-29: 20 mL

## 2023-05-29 MED ORDER — HYOSCYAMINE SULFATE 0.125 MG SL SUBL
0.1250 mg | SUBLINGUAL_TABLET | SUBLINGUAL | Status: DC | PRN
  Administered 2023-05-29 – 2023-06-01 (×7): 0.125 mg via SUBLINGUAL
  Filled 2023-05-29 (×7): qty 1

## 2023-05-29 MED ORDER — HEMOSTATIC AGENTS (NO CHARGE) OPTIME
TOPICAL | Status: DC | PRN
  Administered 2023-05-29: 1 via TOPICAL

## 2023-05-29 MED ORDER — SODIUM CHLORIDE 0.9% FLUSH
INTRAVENOUS | Status: DC | PRN
  Administered 2023-05-29: 20 mL

## 2023-05-29 MED ORDER — HYDROMORPHONE HCL 1 MG/ML IJ SOLN
0.2500 mg | INTRAMUSCULAR | Status: DC | PRN
Start: 2023-05-29 — End: 2023-05-29

## 2023-05-29 MED ORDER — HYDROMORPHONE HCL 1 MG/ML IJ SOLN
0.5000 mg | INTRAMUSCULAR | Status: DC | PRN
Start: 2023-05-29 — End: 2023-06-02
  Administered 2023-05-29 (×2): 1 mg via INTRAVENOUS
  Administered 2023-05-29: 0.5 mg via INTRAVENOUS
  Administered 2023-05-30 – 2023-05-31 (×8): 1 mg via INTRAVENOUS
  Filled 2023-05-29 (×11): qty 1

## 2023-05-29 MED ORDER — SODIUM CHLORIDE 0.45 % IV SOLN: INTRAVENOUS | Status: DC

## 2023-05-29 MED ORDER — DEXAMETHASONE SODIUM PHOSPHATE 10 MG/ML IJ SOLN
INTRAMUSCULAR | Status: AC
  Filled 2023-05-29: qty 1

## 2023-05-29 MED ORDER — IRBESARTAN 150 MG PO TABS
150.0000 mg | ORAL_TABLET | Freq: Every day | ORAL | Status: DC
  Administered 2023-05-29 – 2023-05-31 (×3): 150 mg via ORAL
  Filled 2023-05-29 (×3): qty 1

## 2023-05-29 MED ORDER — DIPHENHYDRAMINE HCL 50 MG/ML IJ SOLN: 12.5000 mg | Freq: Four times a day (QID) | INTRAMUSCULAR | Status: DC | PRN

## 2023-05-29 MED ORDER — LIDOCAINE HCL (PF) 2 % IJ SOLN
INTRAMUSCULAR | Status: AC
  Filled 2023-05-29: qty 5

## 2023-05-29 MED ORDER — DOCUSATE SODIUM 100 MG PO CAPS
100.0000 mg | ORAL_CAPSULE | Freq: Two times a day (BID) | ORAL | Status: DC
  Administered 2023-05-29 – 2023-06-02 (×8): 100 mg via ORAL
  Filled 2023-05-29 (×8): qty 1

## 2023-05-29 MED ORDER — FLUORESCEIN SODIUM 10 % IV SOLN
INTRAVENOUS | Status: AC
  Filled 2023-05-29: qty 5

## 2023-05-29 MED ORDER — HYDROMORPHONE HCL 1 MG/ML IJ SOLN
INTRAMUSCULAR | Status: DC | PRN
Start: 2023-05-29 — End: 2023-05-29
  Administered 2023-05-29 (×4): .2 mg via INTRAVENOUS

## 2023-05-29 MED ORDER — MAGNESIUM CITRATE PO SOLN: 1.0000 | Freq: Once | ORAL | Status: DC

## 2023-05-29 MED ORDER — PROPOFOL 10 MG/ML IV BOLUS
INTRAVENOUS | Status: AC
  Filled 2023-05-29: qty 20

## 2023-05-29 MED ORDER — FENTANYL CITRATE (PF) 100 MCG/2ML IJ SOLN
INTRAMUSCULAR | Status: DC | PRN
  Administered 2023-05-29: 100 ug via INTRAVENOUS

## 2023-05-29 MED ORDER — LACTATED RINGERS IV SOLN: INTRAVENOUS | Status: DC

## 2023-05-29 MED ORDER — LIDOCAINE 2% (20 MG/ML) 5 ML SYRINGE
INTRAMUSCULAR | Status: DC | PRN
  Administered 2023-05-29: 100 mg via INTRAVENOUS

## 2023-05-29 MED ORDER — MIDAZOLAM HCL 2 MG/2ML IJ SOLN
INTRAMUSCULAR | Status: DC | PRN
  Administered 2023-05-29: 2 mg via INTRAVENOUS

## 2023-05-29 MED ORDER — ONDANSETRON HCL 4 MG/2ML IJ SOLN
INTRAMUSCULAR | Status: DC | PRN
  Administered 2023-05-29 (×2): 4 mg via INTRAVENOUS

## 2023-05-29 MED ORDER — ROCURONIUM BROMIDE 10 MG/ML (PF) SYRINGE
PREFILLED_SYRINGE | INTRAVENOUS | Status: DC | PRN
  Administered 2023-05-29 (×2): 20 mg via INTRAVENOUS
  Administered 2023-05-29: 5 mg via INTRAVENOUS
  Administered 2023-05-29: 15 mg via INTRAVENOUS
  Administered 2023-05-29: 10 mg via INTRAVENOUS
  Administered 2023-05-29: 80 mg via INTRAVENOUS

## 2023-05-29 MED ORDER — OXYCODONE HCL 5 MG PO TABS
ORAL_TABLET | ORAL | Status: AC
  Filled 2023-05-29: qty 1

## 2023-05-29 MED ORDER — STERILE WATER FOR IRRIGATION IR SOLN
Status: DC | PRN
  Administered 2023-05-29: 500 mL

## 2023-05-29 MED ORDER — SODIUM CHLORIDE 0.9 % IV SOLN: INTRAVENOUS | Status: DC | PRN

## 2023-05-29 MED ORDER — CIPROFLOXACIN IN D5W 400 MG/200ML IV SOLN
400.0000 mg | INTRAVENOUS | Status: AC
Start: 2023-05-29 — End: 2023-05-29
  Administered 2023-05-29: 400 mg via INTRAVENOUS
  Filled 2023-05-29: qty 200

## 2023-05-29 MED ORDER — SODIUM CHLORIDE 0.9 % IV BOLUS
1000.0000 mL | Freq: Once | INTRAVENOUS | Status: AC
  Administered 2023-05-29: 1000 mL via INTRAVENOUS

## 2023-05-29 MED ORDER — PHENYLEPHRINE HCL-NACL 20-0.9 MG/250ML-% IV SOLN
INTRAVENOUS | Status: AC
  Filled 2023-05-29: qty 250

## 2023-05-29 MED ORDER — OXYCODONE HCL 5 MG PO TABS: 5.0000 mg | ORAL_TABLET | ORAL | 0 refills | Status: AC | PRN

## 2023-05-29 MED ORDER — BUPIVACAINE LIPOSOME 1.3 % IJ SUSP
INTRAMUSCULAR | Status: AC
  Filled 2023-05-29: qty 20

## 2023-05-29 MED ORDER — ACETAMINOPHEN 500 MG PO TABS
1000.0000 mg | ORAL_TABLET | Freq: Once | ORAL | Status: AC
  Administered 2023-05-29: 1000 mg via ORAL
  Filled 2023-05-29: qty 2

## 2023-05-29 MED ORDER — SULFAMETHOXAZOLE-TRIMETHOPRIM 800-160 MG PO TABS: 1.0000 | ORAL_TABLET | Freq: Two times a day (BID) | ORAL | 0 refills | Status: AC

## 2023-05-29 SURGICAL SUPPLY — 63 items
APPLICATOR COTTON TIP 6 STRL (MISCELLANEOUS) ×3 IMPLANT
APPLICATOR COTTON TIP 6IN STRL (MISCELLANEOUS) ×2 IMPLANT
APPLICATOR SURGIFLO ENDO (HEMOSTASIS) IMPLANT
BAG COUNTER SPONGE SURGICOUNT (BAG) IMPLANT
CATH FOLEY 2WAY SLVR 5CC 18FR (CATHETERS) ×3 IMPLANT
CATH ROBINSON RED A/P 16FR (CATHETERS) ×3 IMPLANT
CATH SILICONE 5CC 18FR (INSTRUMENTS) ×3 IMPLANT
CHLORAPREP W/TINT 26 (MISCELLANEOUS) ×3 IMPLANT
CLIP LIGATING HEM O LOK PURPLE (MISCELLANEOUS) ×3 IMPLANT
COVER SURGICAL LIGHT HANDLE (MISCELLANEOUS) ×3 IMPLANT
COVER TIP SHEARS 8 DVNC (MISCELLANEOUS) ×3 IMPLANT
CUTTER ECHEON FLEX ENDO 45 340 (ENDOMECHANICALS) IMPLANT
DERMABOND ADVANCED .7 DNX12 (GAUZE/BANDAGES/DRESSINGS) ×3 IMPLANT
DRAPE ARM DVNC X/XI (DISPOSABLE) ×12 IMPLANT
DRAPE COLUMN DVNC XI (DISPOSABLE) ×3 IMPLANT
DRAPE SURG IRRIG POUCH 19X23 (DRAPES) ×3 IMPLANT
DRIVER NDL LRG 8 DVNC XI (INSTRUMENTS) ×6 IMPLANT
DRIVER NDLE LRG 8 DVNC XI (INSTRUMENTS) ×4 IMPLANT
DRSG TEGADERM 4X4.75 (GAUZE/BANDAGES/DRESSINGS) IMPLANT
ELECT PENCIL ROCKER SW 15FT (MISCELLANEOUS) ×3 IMPLANT
ELECT REM PT RETURN 15FT ADLT (MISCELLANEOUS) ×3 IMPLANT
FORCEPS BPLR 8 MD DVNC XI (FORCEP) ×3 IMPLANT
FORCEPS BPLR FENES DVNC XI (FORCEP) ×3 IMPLANT
FORCEPS PROGRASP DVNC XI (FORCEP) ×3 IMPLANT
GAUZE 4X4 16PLY ~~LOC~~+RFID DBL (SPONGE) IMPLANT
GAUZE SPONGE 4X4 12PLY STRL (GAUZE/BANDAGES/DRESSINGS) ×3 IMPLANT
GLOVE BIO SURGEON STRL SZ 6.5 (GLOVE) ×3 IMPLANT
GLOVE BIOGEL M 7.0 STRL (GLOVE) ×6 IMPLANT
GLOVE BIOGEL PI IND STRL 7.5 (GLOVE) ×6 IMPLANT
GOWN STRL REUS W/ TWL XL LVL3 (GOWN DISPOSABLE) ×6 IMPLANT
GOWN STRL SURGICAL XL XLNG (GOWN DISPOSABLE) ×3 IMPLANT
HEMOSTAT SURGICEL 4X8 (HEMOSTASIS) IMPLANT
HOLDER FOLEY CATH W/STRAP (MISCELLANEOUS) ×3 IMPLANT
IRRIG SUCT STRYKERFLOW 2 WTIP (MISCELLANEOUS) ×2
IRRIGATION SUCT STRKRFLW 2 WTP (MISCELLANEOUS) ×3 IMPLANT
IV LACTATED RINGERS 1000ML (IV SOLUTION) ×3 IMPLANT
KIT TURNOVER KIT A (KITS) IMPLANT
MARKER SKIN DUAL TIP RULER LAB (MISCELLANEOUS) ×3 IMPLANT
NDL INSUFFLATION 14GA 120MM (NEEDLE) ×3 IMPLANT
NEEDLE INSUFFLATION 14GA 120MM (NEEDLE) ×2 IMPLANT
PACK ROBOT UROLOGY CUSTOM (CUSTOM PROCEDURE TRAY) ×3 IMPLANT
PLUG CATH AND CAP STRL 200 (CATHETERS) IMPLANT
PROTECTOR NERVE ULNAR (MISCELLANEOUS) ×3 IMPLANT
RELOAD STAPLE 45 4.1 GRN THCK (STAPLE) IMPLANT
SCISSORS MNPLR CVD DVNC XI (INSTRUMENTS) ×3 IMPLANT
SEAL UNIV 5-12 XI (MISCELLANEOUS) ×12 IMPLANT
SET CYSTO W/LG BORE CLAMP LF (SET/KITS/TRAYS/PACK) IMPLANT
SET TUBE SMOKE EVAC HIGH FLOW (TUBING) ×3 IMPLANT
SOL ELECTROSURG ANTI STICK (MISCELLANEOUS) ×2
SOL PREP POV-IOD 4OZ 10% (MISCELLANEOUS) ×3 IMPLANT
SOLUTION ELECTROSURG ANTI STCK (MISCELLANEOUS) ×3 IMPLANT
STAPLE RELOAD 45 GRN (STAPLE) IMPLANT
SURGIFLO W/THROMBIN 8M KIT (HEMOSTASIS) IMPLANT
SUT ETHILON 2 0 PS N (SUTURE) IMPLANT
SUT MNCRL 3 0 VIOLET RB1 (SUTURE) IMPLANT
SUT MNCRL AB 4-0 PS2 18 (SUTURE) ×6 IMPLANT
SUT PDS AB 0 CT1 36 (SUTURE) ×6 IMPLANT
SUT VIC AB 0 CT1 27XBRD ANTBC (SUTURE) ×3 IMPLANT
SUT VIC AB 2-0 SH 27XBRD (SUTURE) ×3 IMPLANT
SUT VIC AB 3-0 SH 27X BRD (SUTURE) IMPLANT
SUT VIC AB 4-0 RB1 27XBRD (SUTURE) IMPLANT
SUT VLOC 3-0 9IN GRN (SUTURE) IMPLANT
WATER STERILE IRR 1000ML POUR (IV SOLUTION) ×3 IMPLANT

## 2023-05-29 NOTE — Anesthesia Procedure Notes (Signed)
Procedure Name: Intubation Date/Time: 05/29/2023 7:39 AM  Performed by: Sampson Goon, CRNAPre-anesthesia Checklist: Patient identified, Emergency Drugs available, Suction available and Patient being monitored Patient Re-evaluated:Patient Re-evaluated prior to induction Oxygen Delivery Method: Circle System Utilized Preoxygenation: Pre-oxygenation with 100% oxygen Induction Type: IV induction Ventilation: Oral airway inserted - appropriate to patient size and Two handed mask ventilation required Laryngoscope Size: Glidescope and 4 Grade View: Grade I Tube type: Oral Tube size: 7.5 mm Number of attempts: 1 Airway Equipment and Method: Stylet and Oral airway Placement Confirmation: ETT inserted through vocal cords under direct vision, positive ETCO2 and breath sounds checked- equal and bilateral Secured at: 24 cm Tube secured with: Tape Dental Injury: Teeth and Oropharynx as per pre-operative assessment

## 2023-05-29 NOTE — Progress Notes (Signed)
Attempted report 

## 2023-05-29 NOTE — H&P (Signed)
Office Visit Report     05/12/2023   --------------------------------------------------------------------------------   [name]  MRN: [id number]  DOB: [birth year], 57 year old Male  SSN:    PRIMARY CARE:  Eleanora Neighbor, MD  PRIMARY CARE FAX:  [phone]  REFERRING:  Jannifer Hick, MD  PROVIDER:  Jettie Pagan, M.D.  TREATING:  Ulyses Amor, [place]  LOCATION:  [facility] [phone]     --------------------------------------------------------------------------------   CC/HPI: Pt presents today for pre-operative history and physical exam in anticipation of robotic assisted lap radical prostatectomy with bilateral lymph node dissection by Dr. Cardell Peach on 05/29/23. He is doing well and is without complaint.   Pt denies F/C, HA, CP, SOB, N/V, diarrhea/constipation, back pain, flank pain, hematuria, and dysuria.      HX:   [name] is seen to discuss his new diagnosis of prostate cancer and definitive treatment options.   1. Localized favorable intermediate risk prostate cancer:  Patient underwent prostate biopsy on 04/02/2023 for an elevated PSA of 5.01 ng/mL. Biopsy revealed GS 3+4 = 7 in 2 cores and GS 3+3 = 6 in 2 cores with 4/12 total cores adenocarcinoma of the prostate (5-60%), TRUS volume of 61 cm3. Denies new or worsening bone or back pain. Good appetite and stable weight.   Family history: Denies  Imaging studies: None   PMH: Hypertension, achalasia of the esophagus, type 2 diabetes, obesity, obstructive sleep apnea. He denies taking anticoagulation  PSH: No prior abdominal surgeries.   TNM stage: cT1c  PSA: 5.01  Gleason score: GS 3+4 = 7  Biopsy: 04/02/2023  Left: GS 3+4 = 7 at the left mid, left lateral apex and GS 3 = 6 at the left apex  Right: GS from 3 cosigns 6 at the right mid  Prostate volume: 61 cc  PSAD: 0.08   Nomogram  CSS (15-year): 98%  PFS (5 year, 10 year): 85%, 74%  EPE: 30%  LNI: 3%  SVI: 3%   #2. BPH/LUTS:  He does have some mild lower urinary tract symptoms with weaker flow stream and 2-3 time nocturia. IPSS score is 8, QOL of 3.   SHIM score: He did not complete this form. He denies difficulty obtaining maintain erections. He denies taking PDE 5 inhibitors.     ALLERGIES: Penicillin - Hives    MEDICATIONS: Metformin Hcl 500 mg tablet 1 tablet PO Daily  Duloxetine Hcl 30 mg capsule,delayed release 1 capsule PO Daily  Farxiga 10 mg tablet 1 tablet PO Daily  Pantoprazole Sodium 40 mg tablet, delayed release 1 tablet PO Daily  Valsartan 160 mg tablet 1 tablet PO Daily  Vitamin D3 1 PO Daily     GU PSH: Prostate Needle Biopsy - 04/02/2023       PSH Notes: poem procedure 2023   NON-GU PSH: Back surgery, '09. '12 Surgical Pathology, Gross And Microscopic Examination For Prostate Needle - 04/02/2023 Visit Complexity (formerly GPC1X) - 04/16/2023, 02/03/2023     GU PMH: Stress Incontinence - 04/30/2023 Prostate Cancer - 04/21/2023, - 04/16/2023 Urinary Frequency - 04/21/2023, - 04/16/2023, - 02/03/2023 BPH w/LUTS - 04/16/2023, - 04/02/2023, - 02/03/2023 Elevated PSA - 04/02/2023, - 02/03/2023 Nocturia - 02/03/2023    NON-GU PMH: Muscle weakness (generalized) - 04/30/2023, - 04/21/2023 Other muscle spasm - 04/30/2023 Achalasia of cardia Diabetes Type 2 GERD Hypercholesterolemia Hypertension Sleep Apnea    FAMILY HISTORY: Diabetes - Brother, Father High Blood Pressure - Father, Brother Kidney Stones - Father   SOCIAL HISTORY:  Marital Status: Married Current Smoking Status: Patient has never smoked.   Tobacco Use Assessment Completed: Used Tobacco in last 30 days? Does not use smokeless tobacco. Does not drink anymore.  Does not use drugs. Drinks 2 caffeinated drinks per day. Has not had a blood transfusion. 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    REVIEW OF SYSTEMS:    GU Review Male:   Patient denies frequent urination, hard to postpone urination, burning/ pain  with urination, get up at night to urinate, leakage of urine, stream starts and stops, trouble starting your stream, have to strain to urinate , erection problems, and penile pain.  Gastrointestinal (Upper):   Patient denies indigestion/ heartburn, nausea, and vomiting.  Gastrointestinal (Lower):   Patient denies diarrhea and constipation.  Constitutional:   Patient denies fever, night sweats, weight loss, and fatigue.  Skin:   Patient denies skin rash/ lesion and itching.  Eyes:   Patient denies blurred vision and double vision.  Ears/ Nose/ Throat:   Patient denies sore throat and sinus problems.  Hematologic/Lymphatic:   Patient denies swollen glands and easy bruising.  Cardiovascular:   Patient denies leg swelling and chest pains.  Respiratory:   Patient denies cough and shortness of breath.  Endocrine:   Patient denies excessive thirst.  Musculoskeletal:   Patient denies back pain and joint pain.  Neurological:   Patient denies headaches and dizziness.  Psychologic:   Patient denies depression and anxiety.   VITAL SIGNS:      05/12/2023 02:42 PM  Weight 298 lb / 135.17 kg  Height 72 in / 182.88 cm  BP 111/71 mmHg  Pulse 75 /min  BMI 40.4 kg/m   MULTI-SYSTEM PHYSICAL EXAMINATION:    Constitutional: Well-nourished. No physical deformities. Normally developed. Good grooming.  Neck: Neck symmetrical, not swollen. Normal tracheal position.  Respiratory: Normal breath sounds. No labored breathing, no use of accessory muscles.   Cardiovascular: Regular rate and rhythm. No murmur, no gallop.   Lymphatic: No enlargement of neck, axillae, groin.  Skin: No paleness, no jaundice, no cyanosis. No lesion, no ulcer, no rash.  Neurologic / Psychiatric: Oriented to time, oriented to place, oriented to person. No depression, no anxiety, no agitation.  Gastrointestinal: No mass, no tenderness, no rigidity, obese abdomen.   Eyes: Normal conjunctivae. Normal eyelids.  Ears, Nose, Mouth, and Throat:  Left ear no scars, no lesions, no masses. Right ear no scars, no lesions, no masses. Nose no scars, no lesions, no masses. Normal hearing. Normal lips.  Musculoskeletal: Normal gait and station of head and neck.     Complexity of Data:  Records Review:   Previous Patient Records  Urine Test Review:   Urinalysis   05/12/23  Urinalysis  Urine Appearance Clear   Urine Color Yellow   Urine Glucose 3+ mg/dL  Urine Bilirubin Neg mg/dL  Urine Ketones Neg mg/dL  Urine Specific Gravity 1.025   Urine Blood Neg ery/uL  Urine pH 5.5   Urine Protein Trace mg/dL  Urine Urobilinogen 0.2 mg/dL  Urine Nitrites Neg   Urine Leukocyte Esterase Neg leu/uL   PROCEDURES:          Urinalysis - 81003 Dipstick Dipstick Cont'd  Color: Yellow Bilirubin: Neg mg/dL  Appearance: Clear Ketones: Neg mg/dL  Specific Gravity: 1.027 Blood: Neg ery/uL  pH: 5.5 Protein: Trace mg/dL  Glucose: 3+ mg/dL Urobilinogen: 0.2 mg/dL    Nitrites: Neg    Leukocyte Esterase: Neg leu/uL    ASSESSMENT:      ICD-10  Details  1 GU:   Prostate Cancer - C61    PLAN:           Schedule Return Visit/Planned Activity: Keep Scheduled Appointment - Schedule Surgery          Document Letter(s):  Created for Patient: Clinical Summary         Notes:   There are no changes in the patients history or physical exam since last evaluation by Dr. Cardell Peach. Pt is scheduled to undergo RALP with BPLND on 05/29/23.   All pt's questions were answered to the best of my ability.          Next Appointment:      Next Appointment: 05/29/2023 07:30 AM    Appointment Type: Surgery     Location: [facility] [phone]    Provider: Jettie Pagan, M.D.    Reason for Visit: [facility]/OBS RA LAP RAD PROSTATECTOMY AND BPLND WITH AMANDA- $500 PAID    Urology Preoperative H&P   Chief Complaint: Prostate cancer  History of Present Illness: Darrell Whitehead is a 57 y.o. male with prostate cancer here for RALP with b/l PLND. Denies fevers,  chills, dysuria.    Past Medical History:  Diagnosis Date   Ankle disorder    Right ankle; wore cast; no surgery   Arthritis    Cancer (HCC)    prostate cancer   GERD (gastroesophageal reflux disease)    Hypertension    Pre-diabetes    Sinus infection    hx of-last tx. 12'16- no further issues   Sleep apnea    C Pap at night    Past Surgical History:  Procedure Laterality Date   36 HOUR PH STUDY N/A 09/24/2020   Procedure: 24 HOUR PH STUDY with impendence;  Surgeon: Charlott Rakes, MD;  Location: [facility];  Service: Endoscopy;  Laterality: N/A;   BACK SURGERY  06/06/2008   Dr Wynetta Emery   COLONOSCOPY WITH PROPOFOL N/A 08/27/2015   Procedure: COLONOSCOPY WITH PROPOFOL;  Surgeon: Charolett Bumpers, MD;  Location: [facility];  Service: Endoscopy;  Laterality: N/A;   ESOPHAGEAL MANOMETRY N/A 09/24/2020   Procedure: ESOPHAGEAL MANOMETRY (EM);  Surgeon: Charlott Rakes, MD;  Location: [facility];  Service: Endoscopy;  Laterality: N/A;   LUMBAR LAMINECTOMY/DECOMPRESSION MICRODISCECTOMY  09/17/2011   Procedure: LUMBAR LAMINECTOMY/DECOMPRESSION MICRODISCECTOMY 1 LEVEL;  Surgeon: Mariam Dollar, MD;  Location: [facility];  Service: Neurosurgery;  Laterality: Left;  Lumbar Laminectomy/Decompression Microdiscectomy Lumbar Five-Sacral One LEFT side   WISDOM TOOTH EXTRACTION      Allergies:    Family History  Problem Relation Age of Onset   Hypertension Other    Heart disease Other    Diabetes Other    Anesthesia problems Neg Hx    Hypotension Neg Hx    Malignant hyperthermia Neg Hx    Pseudochol deficiency Neg Hx     Social History:  reports that he has never smoked. He does not have any smokeless tobacco history on file. He reports current alcohol use. He reports that he does not use drugs.  ROS: A complete review of systems was performed.  All systems are negative except for pertinent findings as noted.  Physical Exam:  Vital signs in last 24 hours: Temp:  [98.3 F  (36.8 C)] 98.3 F (36.8 C) (11/22 0535) Pulse Rate:  [81] 81 (11/22 0535) Resp:  [18] 18 (11/22 0535) BP: (131)/(86) 131/86 (11/22 0535) SpO2:  [93 %] 93 % (11/22 0535) Weight:  [136.1 kg] 136.1  kg (11/22 0554) Constitutional:  Alert and oriented, No acute distress Cardiovascular: Regular rate and rhythm Respiratory: Normal respiratory effort, Lungs clear bilaterally GI: Abdomen is soft, nontender, nondistended, no abdominal masses GU: No CVA tenderness Lymphatic: No lymphadenopathy Neurologic: Grossly intact, no focal deficits Psychiatric: Normal mood and affect  Laboratory Data:  No results for input(s): "WBC", "HGB", "HCT", "PLT" in the last 72 hours.  No results for input(s): "NA", "K", "CL", "GLUCOSE", "BUN", "CALCIUM", "CREATININE" in the last 72 hours.  Invalid input(s): "CO3"   No results found for this or any previous visit (from the past 24 hour(s)). No results found for this or any previous visit (from the past 240 hour(s)).  Renal Function: Recent Labs    05/22/23 1116  CREATININE 0.85   Estimated Creatinine Clearance: 133.7 mL/min (by C-G formula based on SCr of 0.85 mg/dL).  Radiologic Imaging: No results found.  I independently reviewed the above imaging studies.  Assessment and Plan Nation Kackley is a 57 y.o. male with prostate cancer here for RALP with b/l PLND.     Matt R. Emmery Seiler MD 05/29/2023, 7:15 AM  [facility] Pager: [phone]): [phone]

## 2023-05-29 NOTE — Anesthesia Postprocedure Evaluation (Signed)
Anesthesia Post Note  Patient: Yannick Gentis  Procedure(s) Performed: XI ROBOTIC ASSISTED LAPAROSCOPIC RADICAL PROSTATECTOMY LEVEL 1 BILATERAL PELVIC LYMPHADENECTOMY (Bilateral)     Patient location during evaluation: PACU Anesthesia Type: General Level of consciousness: awake and alert Pain management: pain level controlled Vital Signs Assessment: post-procedure vital signs reviewed and stable Respiratory status: spontaneous breathing, nonlabored ventilation and respiratory function stable Cardiovascular status: blood pressure returned to baseline Postop Assessment: no apparent nausea or vomiting Anesthetic complications: no   No notable events documented.  Last Vitals:  Vitals:   05/29/23 1354 05/29/23 1400  BP: (!) 168/98 (!) 144/90  Pulse: 82 83  Resp: 13 14  Temp:    SpO2: 95% 95%    Last Pain:  Vitals:   05/29/23 1400  TempSrc:   PainSc: 0-No pain                 Shanda Howells

## 2023-05-29 NOTE — Discharge Instructions (Signed)
Activity:  You are encouraged to ambulate frequently (about every hour during waking hours) to help prevent blood clots from forming in your legs or lungs.  However, you should not engage in any heavy lifting (> 10-15 lbs), strenuous activity, or straining. Diet: You should continue a clear liquid diet until passing gas from below.  Once this occurs, you may advance your diet to a soft diet that would be easy to digest (i.e soups, scrambled eggs, mashed potatoes, etc.) for 24 hours just as you would if getting over a bad stomach flu.  If tolerating this diet well for 24 hours, you may then begin eating regular food.  It will be normal to have some amount of bloating, nausea, and abdominal discomfort intermittently. Prescriptions:  You will be provided a prescription for pain medication to take as needed.  If your pain is not severe enough to require the prescription pain medication, you may take Tylenol instead.  You should also take an over the counter stool softener (Colace 100 mg twice daily) to avoid straining with bowel movements as the pain medication may constipate you. Finally, you will also be provided a prescription for an antibiotic to begin the day prior to your return visit in the office for catheter removal. Catheter care: You will be taught how to take care of the catheter by the nursing staff prior to discharge from the hospital.  You may use both a leg bag and the larger bedside bag but it is recommended to at least use the bigger bedside bag at nighttime as the leg bag is small and will fill up overnight and also does not drain as well when lying flat. You may periodically feel a strong urge to void with the catheter in place.  This is a bladder spasm and most often can occur when having a bowel movement or when you are moving around. It is typically self-limited and usually will stop after a few minutes.  You may use some Vaseline or Neosporin around the tip of the catheter to reduce friction  at the tip of the penis. Incisions: You may remove your dressing bandages the 2nd day after surgery.  You most likely will have a few small staples in each of the incisions and once the bandages are removed, the incisions may stay open to air.  You may start showering (not soaking or bathing in water) 48 hours after surgery and the incisions simply need to be patted dry after the shower.  No additional care is needed. What to call us about: You should call the office [phone]) if you develop fever > 101, persistent vomiting, or the catheter stops draining. Also, feel free to call with any other questions you may have and remember the handout that was provided to you as a reference preoperatively which answers many of the common questions that arise after surgery. You may resume aspirin, advil, aleve, vitamins, and supplements 7 days after surgery.

## 2023-05-29 NOTE — Transfer of Care (Signed)
Immediate Anesthesia Transfer of Care Note  Patient: Erby Canseco  Procedure(s) Performed: XI ROBOTIC ASSISTED LAPAROSCOPIC RADICAL PROSTATECTOMY LEVEL 1 BILATERAL PELVIC LYMPHADENECTOMY (Bilateral)  Patient Location: PACU  Anesthesia Type:General  Level of Consciousness: awake and patient cooperative  Airway & Oxygen Therapy: Patient Spontanous Breathing and Patient connected to face mask oxygen  Post-op Assessment: Report given to RN and Post -op Vital signs reviewed and stable  Post vital signs: Reviewed and stable  Last Vitals:  Vitals Value Taken Time  BP 153/102 05/29/23 1330  Temp 36.7 C 05/29/23 1330  Pulse 91 05/29/23 1332  Resp 22 05/29/23 1332  SpO2 98 % 05/29/23 1332  Vitals shown include unfiled device data.  Last Pain:  Vitals:   05/29/23 1330  TempSrc:   PainSc: 0-No pain         Complications: No notable events documented.

## 2023-05-29 NOTE — Op Note (Signed)
Operative Note  Preoperative diagnosis:  1.  Localized prostate cancer  Postoperative diagnosis: 1.  Localized prostate cancer  Procedure(s): 1.  Robotic assisted laparoscopic radical prostatectomy (bilateral nerve sparing) 2.  Robotic assisted laparoscopic bilateral pelvic lymph node dissection  Surgeon: Jettie Pagan, MD  Assistants:   Harrie Foreman, PA  An assistant was required for this surgical procedure.  The duties of the assistant included but were not limited to suctioning, passing suture, camera manipulation, retraction.  This procedure would not be able to be performed without an Geophysicist/field seismologist.   Resident: Sharion Dove, PGY-4  Anesthesia:  General  Complications:  None  EBL:   Specimens: 1.  Prostate with seminal vesicles 2.  Periprostatic fat 3. Bilateral pelvic lymph nodes  Drains/Catheters: 1.  18 French Foley catheter  Intraoperative findings:   Large pannus, very narrow pelvis with large pubic notch making case difficult. Approximately 65 cc prostate.  No accessory pudendal vessels.  Successful bilateral nerve spare. 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 Excellent hemostasis.  Water-tight anastomosis without leak.  Indication:  Darrell Whitehead is a 57 y.o. malewho initially presented with an elevated PSA.  Prostate biopsy showed Gleason 3+4 prostate cancer involving the bilateral sides of the prostate.  Treatment options were discussed with him at length and he chose robotic assisted laparoscopic radical prostatectomy.  The plan is for bilateral nerve sparing.  Bilateral pelvic lymph node dissection was planned due to his risk stratification.  Description of procedure: The indications, alternatives, benefits, and risks were discussed with the patient and informed symptoms obtained.  The patient was brought to the operating room table, positioned supine and secured to the bed with a  safety strap.  All pressure points were carefully padded and pneumatic compression devices were placed on lower extremities.  After the administration of intravenous antibiotics and general endotracheal anesthesia, the patient was repositioned in the dorsal lithotomy position using well-padded Allen stirrups.  The arms were carefully tucked at the patient's side and secured with padding.  The chest was secured in place with foam padding and cloth tape and the table was positioned in approximately 30 degree Trendelenburg.  A rectal exam showed the prostate to be 65cc, without nodules and not fixed. The patient's abdomen, genitalia, and upper thighs were prepped and draped in the standard sterile manner.  A time was completed, verifying the correct patient, surgical procedure and positioning prior to beginning the procedure.  An 13 French urethral catheter was inserted to drain the bladder.  Pneumoperitoneum was introduced by placing a Veress needle into the abdomen superior to the umbilicus and insufflated with CO2 to a pressure of 15 mmHg. An 8 mm blunt tip trocar was placed just above the umbilicus.  The 0 degree camera was then passed under direct visualization.  The abdominal cavity was examined for any sign of injury, adhesions, and identification of anatomic landmarks.  The remainder of the trochars were placed which included 2 separate 8 millimeter robotic trochars which were placed 9 cm laterally and inferiorly to the initially placed camera trocar.  A 12 mm trocar was placed 8 mm lateral to the right robotic trocar.  A separate 8 mm robotic trocar was placed 8 cm lateral to the previously placed left robotic trocar on the left side.  A 5 mm trocar was placed to the right and well above the umbilicus which approximately 10 and 12 cm away from the right-sided trochars.  The  robot was then docked.  I placed monopolar scissors in the right hand, a fenestrated bipolar in the left hand and a prograsp in the  fourth arm.  The urachus and median umbilical ligament was divided and we developed the space of Retzius down to the pubic bone.  I divided the parietal peritoneum laterally up to the vas deferens on each side.  Using the prograsp forcep to provide cranial traction on the urachus, the prostate was then defatted above the prostatic vesicle junction, and the superficial dorsal venous complex was coagulated with bipolar and divided.  We submitted the periprostatic fat for pathological specimen.  The endopelvic fascia was sharply opened bilaterally and the levator muscle fibers were swept posterior laterally allowing for visualization of the deep dorsal venous complex and apex of the prostate.  The puboprostatic ligaments were sharply divided and care was taken to preserve the dorsal venous complex.  I secured the dorsal venous complex with a battery operated stapler.  I then addressed the bladder neck with a 30 degree down lens. I identified the bladder neck by pulling on the Foley catheter.  The fourth arm was applying cranial traction on the urachus.  I divided the anterior bladder neck musculature until I found the anterior bladder neck mucosa which was then incised. I identified the Foley catheter within, the balloon was deflated, we pulled the Foley catheter out into the operating field.  The assistant then used a grasper to apply traction to the Foley catheter and the surgical tech then placed a Marvin on the catheter near the penis for optimal retraction. I discovered a large right ureterocele. Fluorescein was injected that there was excellent efflux of urine from the ureteral orifice on the right as well as the left. I elected not to puncture the ureterocele given the strong efflux of urine.   I then divided the lateral bladder neck mucosa in the posterior bladder neck mucosa.  I was well away from the bilateral ureteral orifices.  I divided the posterior bladder neck musculature until I discovered the  longitudinal fibers and kept dissecting until I found the vas deferens.  The bilateral vas deferens were then freed and divided.  I then freed the bilateral seminal vesicles using blunt and sharp dissection.  I placed metal clips on the seminal vesicle vessels and avoided cautery around the seminal vesicles.  I then switched back to a 0 degree lens.  I divided the Denonvilliers fascia beneath the prostate and developed the prostate off the rectum.  This plane was bluntly developed towards the apex of the prostate.  I then addressed the pedicles, first starting with the right and then moving towards the left. I then did a nerve spare by dividing the lateral pelvic fascia off of the prostatic capsule laterally.  I then isolated the pedicles of the prostate and placed Weck clips on the pedicles and then divided the pedicles with cold scissors.  I continued to divide the neurovascular bundles off of the prostate out to the apex of the prostate.  At this point the prostate was essentially freed up except for the urethra.   I then addressed the prostate anteriorly, dividing the dorsal vein with cautery.  The anterior urethra was then sharply divided with cold scissors.  The Foley catheter was then pulled back and we divided the posterior urethral wall. Patent venous sinuses were then oversewn with a 4-0 Vicryl suture.  The specimen was then placed in a Endo Catch bag and then the bag  was placed in the upper abdomen out of the way.  I then irrigated the pelvis.  We performed a rectal test by instilling air into the rectal Foley.  The test was negative.  There is no concern for rectal injury.  I then over sewed a few bleeders alongside the pedicles with a 4-0 Vicryl suture on an RB1 needle.  I then performed a bilateral pelvic lymph node dissection by incising the fascia overlying the right external iliac vein, dissecting distally.  I went just distal to the node of Cloquet were replaced clips and then divided the  lymphatics.  The lateral aspect of the dissection was the pelvic sidewall, inferior was the obturator nerve and proximal of the hypogastric vessels.  I placed clips at the proximal aspect and then divided the lymphatics.  The specimen was removed with the scope grasper and sent to pathology.  Grossly, there were no enlarged lymph nodes.  This was performed on both the right and left pelvic lymph nodes.  With good hemostasis confirmed, I then did the posterior reconstruction with a Rocco stitch.  I used a 3-0 Vloc double-armed suture on an RB1 needle.  I passed the sutures through the cut edges of Denonvilliers fascia beneath the bladder on the right side and through the posterior serrated sphincter underneath the urethra.  I ran this from right to left.  And then took the other end of the suture passing just proximal to the posterior bladder neck in the midline into the posterior serrated sphincter and around this from right to left using 3 throws and reapproximated the sutures.  I then completed the urethrovesical anastomosis using a 3-0 Vloc suture on an RB1 needle.  I passed both ends of the suture from the outside in through the bladder neck at the 6 o'clock position.  I passed both through the urethral stump from the inside out and the corresponding position.  I reapproximated the bladder neck to the urethra.  I then ran the left suture on the left side anastomosis to the 9 o'clock position.  And then went back to the right-sided suture around that up the right side of the 12 o'clock position.  I then continued the left suture to the 12 o'clock position. I identified the ureteral orifices as well as the large right ureterocele and ensured that these were not incorporated with the sutures.  I then placed a new 18 French Foley catheter into the bladder and filled it with 10 cc of sterile water.  I then secured the knot and then passed the suture behind the pubic bone for anterior suspension.  The bladder  was irrigated with 200 cc of water.  There was no leak.  Hemostasis was excellent.  A 15 French drain was placed through the previously placed fourth arm.  We did place a Carter-Thomason 0 vicryl suture through the 12 mm assistant port. The robot was undocked and all the trochars were removed under direct vision.    I enlarged the umbilical trocar site large enough to remove the prostate and closed the fascia with 0 PDS sutures in a running fashion.  All the port sites were irrigated.  Exparel was injected to the trocar sites.  The skin was closed with 4-0 Monocryl in a running subcuticular fashion.  Skin glue was applied.  At this point, the patient was extubated and awakened in the operating room and taken to recovery room in stable condition.  There were no immediate complications.  All counts  were correct.  Plan: Admit for observation overnight.  Clear liquids tonight.  Regular for breakfast tomorrow.  Anticipate discharge home tomorrow.  Matt R. Conception Doebler MD [facility]  Pager: [phone]

## 2023-05-30 ENCOUNTER — Encounter (HOSPITAL_COMMUNITY): Payer: Self-pay | Admitting: Urology

## 2023-05-30 DIAGNOSIS — I10 Essential (primary) hypertension: Secondary | ICD-10-CM | POA: Diagnosis present

## 2023-05-30 DIAGNOSIS — C61 Malignant neoplasm of prostate: Secondary | ICD-10-CM | POA: Diagnosis present

## 2023-05-30 DIAGNOSIS — Z8249 Family history of ischemic heart disease and other diseases of the circulatory system: Secondary | ICD-10-CM | POA: Diagnosis not present

## 2023-05-30 DIAGNOSIS — N134 Hydroureter: Secondary | ICD-10-CM | POA: Diagnosis present

## 2023-05-30 DIAGNOSIS — N3289 Other specified disorders of bladder: Secondary | ICD-10-CM | POA: Diagnosis present

## 2023-05-30 DIAGNOSIS — E119 Type 2 diabetes mellitus without complications: Secondary | ICD-10-CM | POA: Diagnosis present

## 2023-05-30 DIAGNOSIS — Z6841 Body Mass Index (BMI) 40.0 and over, adult: Secondary | ICD-10-CM | POA: Diagnosis not present

## 2023-05-30 DIAGNOSIS — R351 Nocturia: Secondary | ICD-10-CM | POA: Diagnosis present

## 2023-05-30 DIAGNOSIS — Z1152 Encounter for screening for COVID-19: Secondary | ICD-10-CM | POA: Diagnosis not present

## 2023-05-30 DIAGNOSIS — R3912 Poor urinary stream: Secondary | ICD-10-CM | POA: Diagnosis present

## 2023-05-30 DIAGNOSIS — K219 Gastro-esophageal reflux disease without esophagitis: Secondary | ICD-10-CM | POA: Diagnosis present

## 2023-05-30 DIAGNOSIS — N401 Enlarged prostate with lower urinary tract symptoms: Secondary | ICD-10-CM | POA: Diagnosis present

## 2023-05-30 DIAGNOSIS — E7800 Pure hypercholesterolemia, unspecified: Secondary | ICD-10-CM | POA: Diagnosis present

## 2023-05-30 DIAGNOSIS — Z7984 Long term (current) use of oral hypoglycemic drugs: Secondary | ICD-10-CM | POA: Diagnosis not present

## 2023-05-30 DIAGNOSIS — E66813 Obesity, class 3: Secondary | ICD-10-CM | POA: Diagnosis present

## 2023-05-30 DIAGNOSIS — N179 Acute kidney failure, unspecified: Secondary | ICD-10-CM | POA: Diagnosis present

## 2023-05-30 DIAGNOSIS — Z833 Family history of diabetes mellitus: Secondary | ICD-10-CM | POA: Diagnosis not present

## 2023-05-30 LAB — HEMOGLOBIN AND HEMATOCRIT, BLOOD
HCT: 50.3 % (ref 39.0–52.0)
Hemoglobin: 16.1 g/dL (ref 13.0–17.0)

## 2023-05-30 LAB — GLUCOSE, CAPILLARY
Glucose-Capillary: 149 mg/dL — ABNORMAL HIGH (ref 70–99)
Glucose-Capillary: 163 mg/dL — ABNORMAL HIGH (ref 70–99)
Glucose-Capillary: 147 mg/dL — ABNORMAL HIGH (ref 70–99)
Glucose-Capillary: 154 mg/dL — ABNORMAL HIGH (ref 70–99)

## 2023-05-30 LAB — BASIC METABOLIC PANEL
Anion gap: 11 (ref 5–15)
BUN: 30 mg/dL — ABNORMAL HIGH (ref 6–20)
CO2: 23 mmol/L (ref 22–32)
Calcium: 8.7 mg/dL — ABNORMAL LOW (ref 8.9–10.3)
Chloride: 101 mmol/L (ref 98–111)
Creatinine, Ser: 2.55 mg/dL — ABNORMAL HIGH (ref 0.61–1.24)
GFR, Estimated: 28 mL/min — ABNORMAL LOW (ref >60)
Glucose, Bld: 144 mg/dL — ABNORMAL HIGH (ref 70–99)
Potassium: 4.4 mmol/L (ref 3.5–5.1)
Sodium: 135 mmol/L (ref 135–145)

## 2023-05-30 MED ORDER — KCL IN DEXTROSE-NACL 10-5-0.45 MEQ/L-%-% IV SOLN
INTRAVENOUS | Status: AC
  Filled 2023-05-30 (×3): qty 1000

## 2023-05-30 MED ORDER — CHLORHEXIDINE GLUCONATE CLOTH 2 % EX PADS
6.0000 | MEDICATED_PAD | Freq: Every day | CUTANEOUS | Status: DC
Start: 2023-05-30 — End: 2023-06-02
  Administered 2023-05-30 – 2023-06-02 (×4): 6 via TOPICAL

## 2023-05-30 MED ORDER — SODIUM CHLORIDE 0.9 % IV SOLN: Freq: Once | INTRAVENOUS | Status: AC

## 2023-05-30 NOTE — Progress Notes (Signed)
1 Day Post-Op Subjective: Patient reports no specific complaints.  He had some rectal pressure last night but that has resolved.  He has been up and ambulating.  Objective: Vital signs in last 24 hours: Temp:  [97.6 F (36.4 C)-99.3 F (37.4 C)] 99.3 F (37.4 C) (11/23 1045) Pulse Rate:  [77-99] 85 (11/23 1045) Resp:  [12-20] 14 (11/23 1045) BP: (124-168)/(79-116) 126/81 (11/23 1045) SpO2:  [93 %-100 %] 97 % (11/23 1045)  Intake/Output from previous day: 11/22 0701 - 11/23 0700 In: 2832.4 [P.O.:290; I.V.:2342.4; IV Piggyback:200] Out: 775 [Urine:325; Drains:325; Blood:125] Intake/Output this shift: Total I/O In: 300 [I.V.:300] Out: 275 [Urine:275]  Physical Exam:  No acute distress, watching TV, alert and oriented Cardiovascular-regular rate and rhythm Respiratory-regular effort and depth Abdomen-soft and nontender, port sites clean dry and intact Extremity-no calf pain or swelling GU-Foley catheter in place - urine dark yellow  Lab Results: Recent Labs    05/29/23 1438 05/30/23 0555  HGB 15.7 16.1  HCT 46.8 50.3   BMET Recent Labs    05/30/23 0555  NA 135  K 4.4  CL 101  CO2 23  GLUCOSE 144*  BUN 30*  CREATININE 2.55*  CALCIUM 8.7*   No results for input(s): "LABPT", "INR" in the last 72 hours. No results for input(s): "LABURIN" in the last 72 hours. Results for orders placed or performed during the hospital encounter of 09/20/20  SARS CORONAVIRUS 2 (TAT 6-24 HRS) Nasopharyngeal Nasopharyngeal Swab     Status: None   Collection Time: 09/20/20 11:22 AM   Specimen: Nasopharyngeal Swab  Result Value Ref Range Status   SARS Coronavirus 2 NEGATIVE NEGATIVE Final    Comment: (NOTE) SARS-CoV-2 target nucleic acids are NOT DETECTED.  The SARS-CoV-2 RNA is generally detectable in upper and lower respiratory specimens during the acute phase of infection. Negative results do not preclude SARS-CoV-2 infection, do not rule out co-infections with other pathogens,  and should not be used as the sole basis for treatment or other patient management decisions. Negative results must be combined with clinical observations, patient history, and epidemiological information. The expected result is Negative.  Fact Sheet for Patients: HairSlick.no  Fact Sheet for Healthcare Providers: quierodirigir.com  This test is not yet approved or cleared by the [place] FDA and  has been authorized for detection and/or diagnosis of SARS-CoV-2 by FDA under an Emergency Use Authorization (EUA). This EUA will remain  in effect (meaning this test can be used) for the duration of the COVID-19 declaration under Se ction 564(b)(1) of the Act, 21 U.S.C. section 360bbb-3(b)(1), unless the authorization is terminated or revoked sooner.  Performed at [facility], [address], [place], [place] [address]     Studies/Results: No results found.  Assessment/Plan: -Postop day 1 robotic assisted laparoscopic radical prostatectomy.  Bump in creatinine to 2.55 with a BUN of 30.  Urine output marginal.  Will bolus and continue IV fluids.  Recheck creatinine in a.m.  Reviewed op note and ureters were readily visualized with good efflux. -Continue to ambulate -Advance diet as tolerated   LOS: 0 days   Jerilee Field 05/30/2023, 1:17 PM

## 2023-05-30 NOTE — Plan of Care (Signed)
  Problem: Education: Goal: Knowledge of the procedure and recovery process will improve Outcome: Progressing   Problem: Pain Management: Goal: General experience of comfort will improve Outcome: Progressing   Problem: Skin Integrity: Goal: Demonstration of wound healing without infection will improve Outcome: Progressing   Problem: Education: Goal: Knowledge of General Education information will improve Description: Including pain rating scale, medication(s)/side effects and non-pharmacologic comfort measures Outcome: Progressing   Problem: Activity: Goal: Risk for activity intolerance will decrease Outcome: Progressing   Problem: Coping: Goal: Level of anxiety will decrease Outcome: Progressing   Problem: Elimination: Goal: Will not experience complications related to urinary retention Outcome: Progressing   Problem: Pain Management: Goal: General experience of comfort will improve Outcome: Progressing   Problem: Safety: Goal: Ability to remain free from injury will improve Outcome: Progressing   Problem: Skin Integrity: Goal: Risk for impaired skin integrity will decrease Outcome: Progressing

## 2023-05-31 ENCOUNTER — Inpatient Hospital Stay (HOSPITAL_COMMUNITY): Payer: 59

## 2023-05-31 LAB — CK: Total CK: 280 U/L (ref 49–397)

## 2023-05-31 LAB — BASIC METABOLIC PANEL
Anion gap: 8 (ref 5–15)
BUN: 33 mg/dL — ABNORMAL HIGH (ref 6–20)
CO2: 19 mmol/L — ABNORMAL LOW (ref 22–32)
Calcium: 8.3 mg/dL — ABNORMAL LOW (ref 8.9–10.3)
Chloride: 105 mmol/L (ref 98–111)
Creatinine, Ser: 3.03 mg/dL — ABNORMAL HIGH (ref 0.61–1.24)
GFR, Estimated: 23 mL/min — ABNORMAL LOW (ref >60)
Glucose, Bld: 165 mg/dL — ABNORMAL HIGH (ref 70–99)
Potassium: 3.8 mmol/L (ref 3.5–5.1)
Sodium: 132 mmol/L — ABNORMAL LOW (ref 135–145)

## 2023-05-31 LAB — GLUCOSE, CAPILLARY
Glucose-Capillary: 174 mg/dL — ABNORMAL HIGH (ref 70–99)
Glucose-Capillary: 173 mg/dL — ABNORMAL HIGH (ref 70–99)
Glucose-Capillary: 156 mg/dL — ABNORMAL HIGH (ref 70–99)
Glucose-Capillary: 135 mg/dL — ABNORMAL HIGH (ref 70–99)

## 2023-05-31 LAB — HEMOGLOBIN AND HEMATOCRIT, BLOOD
HCT: 45 % (ref 39.0–52.0)
Hemoglobin: 14.7 g/dL (ref 13.0–17.0)

## 2023-05-31 LAB — CREATININE, FLUID (PLEURAL, PERITONEAL, JP DRAINAGE): Creat, Fluid: 3.4 mg/dL

## 2023-05-31 MED ORDER — MIRABEGRON ER 25 MG PO TB24
25.0000 mg | ORAL_TABLET | Freq: Once | ORAL | Status: AC
  Administered 2023-05-31: 25 mg via ORAL
  Filled 2023-05-31: qty 1

## 2023-05-31 MED ORDER — LACTATED RINGERS IV SOLN: INTRAVENOUS | Status: AC

## 2023-05-31 MED ORDER — OXYBUTYNIN CHLORIDE 5 MG PO TABS: 5.0000 mg | ORAL_TABLET | Freq: Three times a day (TID) | ORAL | Status: DC

## 2023-05-31 MED ORDER — OXYBUTYNIN CHLORIDE 5 MG PO TABS
5.0000 mg | ORAL_TABLET | Freq: Once | ORAL | Status: AC
  Administered 2023-05-31: 5 mg via ORAL
  Filled 2023-05-31: qty 1

## 2023-05-31 MED ORDER — OXYBUTYNIN CHLORIDE 5 MG PO TABS
5.0000 mg | ORAL_TABLET | Freq: Three times a day (TID) | ORAL | Status: DC | PRN
  Administered 2023-05-31 – 2023-06-01 (×2): 5 mg via ORAL
  Filled 2023-05-31 (×2): qty 1

## 2023-05-31 MED ORDER — LACTATED RINGERS IV BOLUS
3000.0000 mL | Freq: Once | INTRAVENOUS | Status: AC
  Administered 2023-05-31: 3000 mL via INTRAVENOUS

## 2023-05-31 NOTE — Progress Notes (Addendum)
2 Days Post-Op Subjective: Patient reports feeling much better. Reports after the bladder spasm this AM has felt well and no pain. Got some sleep.   Objective: Vital signs in last 24 hours: Temp:  [97.8 F (36.6 C)-99.4 F (37.4 C)] 98.4 F (36.9 C) (11/24 0349) Pulse Rate:  [85-105] 105 (11/24 0349) Resp:  [14-16] 16 (11/24 0349) BP: (126-187)/(81-109) 158/90 (11/24 0400) SpO2:  [93 %-97 %] 93 % (11/24 0349)  Intake/Output from previous day: 11/23 0701 - 11/24 0700 In: 3101.5 [P.O.:150; I.V.:2951.5] Out: 2140 [Urine:2125; Drains:15] +900 in bag   Intake/Output this shift: No intake/output data recorded.  Physical Exam:  NAD, alert and oriented Cardiovascular-regular rate and rhythm Respiratory-regular effort and depth Abdomen-soft nontender, incisions are clean dry and intact.  Scant serous drainage in the bulb.  Flanks and back are clear of pain or bruising. GU-Foley catheter in place.  Urine looks much lighter this morning and clear. Extremity-I had him stand and check the buttocks, thighs and legs and do not see any sign of bruising, pain or swelling.  Lab Results: Recent Labs    05/29/23 1438 05/30/23 0555 05/31/23 0518  HGB 15.7 16.1 14.7  HCT 46.8 50.3 45.0   BMET Recent Labs    05/30/23 0555 05/31/23 0518  NA 135 132*  K 4.4 3.8  CL 101 105  CO2 23 19*  GLUCOSE 144* 165*  BUN 30* 33*  CREATININE 2.55* 3.03*  CALCIUM 8.7* 8.3*   JP creatinine 3.4  No results for input(s): "LABPT", "INR" in the last 72 hours. No results for input(s): "LABURIN" in the last 72 hours. Results for orders placed or performed during the hospital encounter of 09/20/20  SARS CORONAVIRUS 2 (TAT 6-24 HRS) Nasopharyngeal Nasopharyngeal Swab     Status: None   Collection Time: 09/20/20 11:22 AM   Specimen: Nasopharyngeal Swab  Result Value Ref Range Status   SARS Coronavirus 2 NEGATIVE NEGATIVE Final    Comment: (NOTE) SARS-CoV-2 target nucleic acids are NOT  DETECTED.  The SARS-CoV-2 RNA is generally detectable in upper and lower respiratory specimens during the acute phase of infection. Negative results do not preclude SARS-CoV-2 infection, do not rule out co-infections with other pathogens, and should not be used as the sole basis for treatment or other patient management decisions. Negative results must be combined with clinical observations, patient history, and epidemiological information. The expected result is Negative.  Fact Sheet for Patients: HairSlick.no  Fact Sheet for Healthcare Providers: quierodirigir.com  This test is not yet approved or cleared by the [place] FDA and  has been authorized for detection and/or diagnosis of SARS-CoV-2 by FDA under an Emergency Use Authorization (EUA). This EUA will remain  in effect (meaning this test can be used) for the duration of the COVID-19 declaration under Se ction 564(b)(1) of the Act, 21 U.S.C. section 360bbb-3(b)(1), unless the authorization is terminated or revoked sooner.  Performed at [facility], [address], [place], [place] [address]     Studies/Results: CT ABDOMEN PELVIS WO CONTRAST  Result Date: 05/31/2023 CLINICAL DATA:  Abdominal pain. Postop kidney failure. Evaluate for pelvic hematoma. Status post robotic assisted radical prostatectomy. EXAM: CT ABDOMEN AND PELVIS WITHOUT CONTRAST TECHNIQUE: Multidetector CT imaging of the abdomen and pelvis was performed following the standard protocol without IV contrast. RADIATION DOSE REDUCTION: This exam was performed according to the departmental dose-optimization program which includes automated exposure control, adjustment of the mA and/or kV according to patient size and/or use of iterative  reconstruction technique. COMPARISON:  MRI abdomen 10/18/2019. FINDINGS: Lower chest: Mild dependent changes in the lung bases. No significant pleural effusion or  airspace consolidation. Hepatobiliary: Contour of the liver is slightly irregular and there is hypertrophy of the lateral segment and caudate lobe of liver no focal liver abnormality identified. Gallbladder appears normal. No bile duct dilatation. Pancreas: Unremarkable. No pancreatic ductal dilatation or surrounding inflammatory changes. Spleen: Normal in size without focal abnormality. Adrenals/Urinary Tract: Normal adrenal glands. No nephrolithiasis identified bilaterally. Benign cyst off the lower pole of left kidney measures 3.2 cm. No follow-up imaging recommended. There is bilateral perinephric fat stranding and bilateral pelvocaliectasis. Mild right hydroureter. No obstructing stones identified. The urinary bladder is partially decompressed around a Foley catheter. Stomach/Bowel: Stomach is normal. No pathologic dilatation of the large or small bowel loops to suggest obstruction or ileus. The appendix is visualized and is normal. Scattered colonic diverticula without signs of acute diverticulitis. Vascular/Lymphatic: Normal caliber abdominal aorta. No signs abdominopelvic adenopathy. Reproductive: Postoperative changes from prostatectomy. No significant hematoma identified within the prostate bed. Low-attenuation fluid within the low pelvis and prostate bed measures fluid density. Other: No large hematoma identified. Small bilateral extraperitoneal low to intermediate density fluid is identified extending into the retroperitoneal space bilaterally, right greater than left., image 72/2. There is a lower abdominal percutaneous drainage catheter which enters from a left lower quadrant approach and terminates in the right lower quadrant. Pneumoperitoneum is retroperitoneal gas is identified scratch set small amount of gas, predominantly within the extra peritoneal and retroperitoneal space is identified, likely postoperative. Musculoskeletal: No acute or significant osseous findings. Lumbar degenerative disc  disease. IMPRESSION: 1. Postoperative changes from prostatectomy. No significant hematoma identified within the prostate bed. Low-attenuation fluid within the low pelvis and prostate bed measures fluid density. 2. Small volume of bilateral extraperitoneal low to intermediate density fluid is identified extending into the retroperitoneal space bilaterally, right greater than left. This is likely within normal limits given recent surgery. No significant hematoma identified. 3. Lower abdominal percutaneous drainage catheter which enters from a left lower quadrant approach and terminates in the right lower quadrant. 4. Bilateral perinephric fat stranding and bilateral pelvocaliectasis. Mild right hydroureter. No obstructing stones identified. 5. Contour of the liver is slightly irregular and there is hypertrophy of the lateral segment and caudate lobe of liver. Findings are equivocal for cirrhosis. Correlate with liver function tests. 6. Scattered colonic diverticula without signs of acute diverticulitis. Electronically Signed   By: Signa Kell M.D.   On: 05/31/2023 06:16    Assessment/Plan: Postop day #2 robotic assisted lap radical prostatectomy with bilateral pelvic lymph node dissection-  1) acute kidney injury-consulted nephrology.  Mild right hydro on CT and known right ureterocele but do not think this is significantly contributing issue.  Dr. Cardell Peach was able to visualize both ureteral orifices in the OR and saw good efflux, but we will continue to keep monitoring.  2) bladder and pelvic pain-he did well after oxybutynin and Myrbetriq. CT scan reassuring no major fluid collections or hematoma.  Bladder decompressed and catheter in place.  3) continue ambulation.  He is currently ambulating in the hall.  4) advance diet as tolerated     LOS: 1 day   Jerilee Field 05/31/2023, 10:43 AM

## 2023-05-31 NOTE — Consult Note (Signed)
Renal Service Consult Note [facility]  Darrell Whitehead 05/31/2023 Darrell Krabbe, MD Requesting Physician: Dr. Mena Goes  Reason for Consult: Renal failure HPI: The patient is a 57 y.o. year-old w/ PMH as below who presented to [facility] for elective lap prostatectomy and pelvic lymph node dissection. The surgery was done on 11/22. Pre-op creatinine was 0.85 on 11/15. POD #1 the creat was up at 2.55 and today is up to 3.03.  We are asked to see for renal failure.   Pt seen in room. Pt is up in the chair. Having lots of back pain issues post-op.  Has catheter in (foley) and UOP has been okay, had 700 cc out this shift so far.  Urine yesterday per RN was quite dark and today has lightened up some (still dark just not as dark). Pt is thirsty w/ some dry mouth when asked. No abd pain. +post op pelvic pain. Foley cath in place. No posterior butt or leg pains. No hx of kidney problems.     ROS - denies CP, no joint pain, no HA, no blurry vision, no rash, no diarrhea, no nausea/ vomiting, no dysuria, no difficulty voiding   Past Medical History  Past Medical History:  Diagnosis Date   Ankle disorder    Right ankle; wore cast; no surgery   Arthritis    Cancer (HCC)    prostate cancer   GERD (gastroesophageal reflux disease)    Hypertension    Pre-diabetes    Sinus infection    hx of-last tx. 12'16- no further issues   Sleep apnea    C Pap at night   Past Surgical History  Past Surgical History:  Procedure Laterality Date   64 HOUR PH STUDY N/A 09/24/2020   Procedure: 24 HOUR PH STUDY with impendence;  Surgeon: Charlott Rakes, MD;  Location: [facility];  Service: Endoscopy;  Laterality: N/A;   BACK SURGERY  06/06/2008   Dr Wynetta Emery   COLONOSCOPY WITH PROPOFOL N/A 08/27/2015   Procedure: COLONOSCOPY WITH PROPOFOL;  Surgeon: Charolett Bumpers, MD;  Location: [facility];  Service: Endoscopy;  Laterality: N/A;   ESOPHAGEAL MANOMETRY N/A 09/24/2020   Procedure: ESOPHAGEAL  MANOMETRY (EM);  Surgeon: Charlott Rakes, MD;  Location: [facility];  Service: Endoscopy;  Laterality: N/A;   LUMBAR LAMINECTOMY/DECOMPRESSION MICRODISCECTOMY  09/17/2011   Procedure: LUMBAR LAMINECTOMY/DECOMPRESSION MICRODISCECTOMY 1 LEVEL;  Surgeon: Mariam Dollar, MD;  Location: [facility];  Service: Neurosurgery;  Laterality: Left;  Lumbar Laminectomy/Decompression Microdiscectomy Lumbar Five-Sacral One LEFT side   LYMPHADENECTOMY Bilateral 05/29/2023   Procedure: BILATERAL PELVIC LYMPHADENECTOMY;  Surgeon: Jannifer Hick, MD;  Location: [facility];  Service: Urology;  Laterality: Bilateral;   ROBOT ASSISTED LAPAROSCOPIC RADICAL PROSTATECTOMY N/A 05/29/2023   Procedure: XI ROBOTIC ASSISTED LAPAROSCOPIC RADICAL PROSTATECTOMY LEVEL 1;  Surgeon: Jannifer Hick, MD;  Location: [facility];  Service: Urology;  Laterality: N/A;  240 MNUTES NEEDED FOR CASE   WISDOM TOOTH EXTRACTION     Family History  Family History  Problem Relation Age of Onset   Hypertension Other    Heart disease Other    Diabetes Other    Anesthesia problems Neg Hx    Hypotension Neg Hx    Malignant hyperthermia Neg Hx    Pseudochol deficiency Neg Hx    Social History  reports that he has never smoked. He does not have any smokeless tobacco history on file. He reports current alcohol use. He reports that he does not use drugs. Allergies  Home medications Prior to Admission medications   Medication Sig Start Date End Date Taking? Authorizing Provider  acetaminophen (TYLENOL) 325 MG tablet Take 650 mg by mouth every 6 (six) hours as needed for moderate pain (pain score 4-6) or headache.   Yes [provider]  docusate sodium (COLACE) 100 MG capsule Take 1 capsule (100 mg total) by mouth 2 (two) times daily. 05/29/23  Yes Dancy, Amanda, PA-C  FARXIGA 10 MG TABS tablet Take 10 mg by mouth daily.   Yes [provider]  metFORMIN (GLUCOPHAGE-XR) 500 MG 24 hr tablet Take 500 mg by mouth daily with breakfast.  10/30/16  Yes [provider]  NON FORMULARY Pt uses a c-pap nightly   Yes [provider]  oxyCODONE (ROXICODONE) 5 MG immediate release tablet Take 1 tablet (5 mg total) by mouth every 4 (four) hours as needed for moderate pain (pain score 4-6) or severe pain (pain score 7-10). 05/29/23 05/28/24 Yes Dancy, Marchelle Folks, PA-C  pantoprazole (PROTONIX) 40 MG tablet Take 40 mg by mouth daily.   Yes [provider]  sulfamethoxazole-trimethoprim (BACTRIM DS) 800-160 MG tablet Take 1 tablet by mouth 2 (two) times daily. Start the day prior to foley removal appointment 05/29/23  Yes Dancy, Marchelle Folks, PA-C  valsartan (DIOVAN) 160 MG tablet Take 160 mg by mouth daily. 11/10/18  Yes [provider]  Cholecalciferol 125 MCG (5000 UT) TABS Take 5,000 Units by mouth daily. 07/07/20   [provider]  fluticasone (FLONASE) 50 MCG/ACT nasal spray Place 2 sprays into both nostrils daily as needed for allergies.    [provider]     Vitals:   05/31/23 0348 05/31/23 0349 05/31/23 0400 05/31/23 1255  BP: (!) 187/109 (!) 158/87 (!) 158/90 (!) 152/98  Pulse: (!) 104 (!) 105  (!) 101  Resp: 16 16  14   Temp: 98.4 F (36.9 C) 98.4 F (36.9 C)  100 F (37.8 C)  TempSrc: Oral Oral  Oral  SpO2: 93% 93%  93%  Weight:      Height:       Exam Gen alert, no distress, adult male up in the chair On room air No rash, cyanosis or gangrene Sclera anicteric, throat clear and a bit dry  No jvd or bruits Chest clear bilat to bases, no rales/ wheezing RRR no RG Abd soft ntnd obese no mass or ascites +bs GU foley cath draining reddish clear urine in the tube MS no joint effusions or deformity Ext no LE or UE edema, no wounds or ulcers Neuro is alert, Ox 3 , nf, no asterixis   Renal-related home meds: - farxiga 10 - flonase - metformin  - valsartan 150 every day - other: protonix, vit d , tylenol     CT abd / pelv noncon 11/24 - 4. Bilateral perinephric fat stranding  and bilateral pelvocaliectasis. Mild right hydroureter. No obstructing stones identified. Contour of the liver is slightly irregular and there is hypertrophy of the lateral segment and caudate lobe of liver. Findings are equivocal for cirrhosis. Correlate with liver function tests.   UA, UNa, UCr pending    BP's wnl since admission,  125 - 165/ 79- 98    No IV contrast, no nsaids, acei's    Was on ARB and rec'd 3 daily doses here; have dc'd it today    Urine sediment (undersigned, today) --> spun down to mostly rbc's and red-colored urine. Micro showed all monomorphic rbc's w/o gran cast, rbc cast or there casts.  No casts where seen . RBC's > 50-100 /hpf, wbc's 25-50 / hpf      Assessment/ Plan: AKI - no hx of CKD. Baseline creat 0.86 from 11/15 pre-op labs. Creat here post -op D#1 was up to 2.55 yest and up to 3.0 today, in the setting of recent prostatectomy done here on 11/22. UOP is decent. CT abd showing mild R hydroureter, o/w normal appearing kidneys. UA and urine lytes pending. Urine sediment was benign w/ sheets of monomorphic rbc's and less so wbc's. No granular casts and CPK was normal. May be dry on exam. Did get ARB here x 3 doses (is now dc'd). Suspect AKI due to vol depletion + ARB effects.  Will bolus 3 liters of LR and then give LR 85 cc/hr overnight. Follow labs and UOP in am.  HTN - bp's up a bit. If needed we can give norvasc or hydralazine.  SP robotic assisted lap prostatectomy - on 11/22      Vinson Moselle  MD [facility] 05/31/2023, 2:35 PM  Recent Labs  Lab 05/30/23 0555 05/31/23 0518  HGB 16.1 14.7  CALCIUM 8.7* 8.3*  CREATININE 2.55* 3.03*  K 4.4 3.8   Inpatient medications:  Chlorhexidine Gluconate Cloth  6 each Topical Daily   docusate sodium  100 mg Oral BID   insulin aspart  0-15 Units Subcutaneous TID WC   pantoprazole  40 mg Oral Daily    diphenhydrAMINE **OR** diphenhydrAMINE, HYDROmorphone (DILAUDID) injection, hyoscyamine, neomycin-bacitracin-polymyxin,  ondansetron, mouth rinse, oxybutynin, oxyCODONE

## 2023-05-31 NOTE — Plan of Care (Signed)
  Problem: Education: Goal: Knowledge of the procedure and recovery process will improve Outcome: Progressing   Problem: Bowel/Gastric: Goal: Gastrointestinal status for postoperative course will improve Outcome: Progressing   Problem: Pain Management: Goal: General experience of comfort will improve Outcome: Progressing   Problem: Skin Integrity: Goal: Demonstration of wound healing without infection will improve Outcome: Progressing   Problem: Urinary Elimination: Goal: Ability to avoid or minimize complications of infection will improve Outcome: Progressing Goal: Ability to achieve and maintain urine output will improve Outcome: Progressing Goal: Home care management will improve Outcome: Progressing   Problem: Education: Goal: Knowledge of General Education information will improve Description: Including pain rating scale, medication(s)/side effects and non-pharmacologic comfort measures Outcome: Progressing   Problem: Health Behavior/Discharge Planning: Goal: Ability to manage health-related needs will improve Outcome: Progressing   Problem: Clinical Measurements: Goal: Ability to maintain clinical measurements within normal limits will improve Outcome: Progressing Goal: Will remain free from infection Outcome: Progressing Goal: Diagnostic test results will improve Outcome: Progressing Goal: Respiratory complications will improve Outcome: Progressing Goal: Cardiovascular complication will be avoided Outcome: Progressing   Problem: Activity: Goal: Risk for activity intolerance will decrease Outcome: Progressing   Problem: Nutrition: Goal: Adequate nutrition will be maintained Outcome: Progressing   Problem: Coping: Goal: Level of anxiety will decrease Outcome: Progressing   Problem: Elimination: Goal: Will not experience complications related to bowel motility Outcome: Progressing Goal: Will not experience complications related to urinary  retention Outcome: Progressing   Problem: Pain Management: Goal: General experience of comfort will improve Outcome: Progressing   Problem: Safety: Goal: Ability to remain free from injury will improve Outcome: Progressing   Problem: Skin Integrity: Goal: Risk for impaired skin integrity will decrease Outcome: Progressing   Problem: Education: Goal: Ability to describe self-care measures that may prevent or decrease complications (Diabetes Survival Skills Education) will improve Outcome: Progressing Goal: Individualized Educational Video(s) Outcome: Progressing   Problem: Coping: Goal: Ability to adjust to condition or change in health will improve Outcome: Progressing   Problem: Fluid Volume: Goal: Ability to maintain a balanced intake and output will improve Outcome: Progressing   Problem: Health Behavior/Discharge Planning: Goal: Ability to identify and utilize available resources and services will improve Outcome: Progressing Goal: Ability to manage health-related needs will improve Outcome: Progressing   Problem: Metabolic: Goal: Ability to maintain appropriate glucose levels will improve Outcome: Progressing   Problem: Nutritional: Goal: Maintenance of adequate nutrition will improve Outcome: Progressing Goal: Progress toward achieving an optimal weight will improve Outcome: Progressing   Problem: Skin Integrity: Goal: Risk for impaired skin integrity will decrease Outcome: Progressing   Problem: Tissue Perfusion: Goal: Adequacy of tissue perfusion will improve Outcome: Progressing

## 2023-05-31 NOTE — Progress Notes (Addendum)
Anias having bladder spasm and rectal pressure again. No fever, HR and BP are up some. Nurse irrigated foley and it is draining.  Intake/Output Summary (Last 24 hours) at 05/31/2023 0432 Last data filed at 05/31/2023 0028 Gross per 24 hour  Intake 2322.71 ml  Output 2165 ml  Net 157.71 ml   UOP much better. Give oxyb and myrbetriq and check labs. Will go ahead and get CT - check for hydro, ensure bladder drainage, check for pelvic hematoma, assess rectum/pelvis.   ADD: CT reviewed -- I don't see anything major. Bladder drained. No large pelvic fluid collections. Labs pending. Nurse reports pt comfortable and now resting.

## 2023-06-01 LAB — CBC WITH DIFFERENTIAL/PLATELET
Abs Immature Granulocytes: 0.07 10*3/uL (ref 0.00–0.07)
Basophils Absolute: 0 10*3/uL (ref 0.0–0.1)
Basophils Relative: 0 %
Eosinophils Absolute: 0 10*3/uL (ref 0.0–0.5)
Eosinophils Relative: 0 %
HCT: 39.7 % (ref 39.0–52.0)
Hemoglobin: 13 g/dL (ref 13.0–17.0)
Immature Granulocytes: 1 %
Lymphocytes Relative: 10 %
Lymphs Abs: 1.4 10*3/uL (ref 0.7–4.0)
MCH: 30.4 pg (ref 26.0–34.0)
MCHC: 32.7 g/dL (ref 30.0–36.0)
MCV: 93 fL (ref 80.0–100.0)
Monocytes Absolute: 1.9 10*3/uL — ABNORMAL HIGH (ref 0.1–1.0)
Monocytes Relative: 14 %
Neutro Abs: 10.1 10*3/uL — ABNORMAL HIGH (ref 1.7–7.7)
Neutrophils Relative %: 75 %
Platelets: 221 10*3/uL (ref 150–400)
RBC: 4.27 MIL/uL (ref 4.22–5.81)
RDW: 13.4 % (ref 11.5–15.5)
WBC: 13.5 10*3/uL — ABNORMAL HIGH (ref 4.0–10.5)
nRBC: 0 % (ref 0.0–0.2)

## 2023-06-01 LAB — BASIC METABOLIC PANEL
Anion gap: 9 (ref 5–15)
BUN: 32 mg/dL — ABNORMAL HIGH (ref 6–20)
CO2: 22 mmol/L (ref 22–32)
Calcium: 8.6 mg/dL — ABNORMAL LOW (ref 8.9–10.3)
Chloride: 108 mmol/L (ref 98–111)
Creatinine, Ser: 2.99 mg/dL — ABNORMAL HIGH (ref 0.61–1.24)
GFR, Estimated: 23 mL/min — ABNORMAL LOW (ref >60)
Glucose, Bld: 115 mg/dL — ABNORMAL HIGH (ref 70–99)
Potassium: 3.8 mmol/L (ref 3.5–5.1)
Sodium: 139 mmol/L (ref 135–145)

## 2023-06-01 LAB — GLUCOSE, CAPILLARY
Glucose-Capillary: 115 mg/dL — ABNORMAL HIGH (ref 70–99)
Glucose-Capillary: 125 mg/dL — ABNORMAL HIGH (ref 70–99)
Glucose-Capillary: 153 mg/dL — ABNORMAL HIGH (ref 70–99)

## 2023-06-01 MED ORDER — POLYETHYLENE GLYCOL 3350 17 G PO PACK
17.0000 g | PACK | Freq: Every day | ORAL | Status: DC
  Administered 2023-06-02: 17 g via ORAL
  Filled 2023-06-01: qty 1

## 2023-06-01 NOTE — Progress Notes (Signed)
[facility] Progress Note    Assessment/ Plan:   AKI - no hx of CKD but does follow with Dr. Ronalee Belts for proteinuria (is due for follow up in 2025). Baseline creat 0.86 from 11/15 pre-op labs. Creat here post -op D#1 was up to 2.55 yest and up to 3.0, in the setting of recent prostatectomy done here on 11/22. UOP is decent. CT abd showing mild R hydroureter, o/w normal appearing kidneys. UA and urine lytes pending. Urine sediment was benign w/ sheets of monomorphic rbc's and less so wbc's.  Suspecting his AKI is secondary to prerenal and hemodynamic insults from surgery and then concominant ARB use whilst in AKI. C/w supportive care/isotonic fluids for now. Cr stable 2.9 but has robust urine output, hopefully is in plateau phase and will start seeing some improvement soon. Would recommend holding ARB and Farxiga until he follows up with Dr. Ronalee Belts HTN - bp's up a bit. If needed we can give norvasc or hydralazine.  SP robotic assisted lap prostatectomy - on 11/22, per urology  Subjective:   Patient seen and examined bedside. Uop 2.8L. rec'd 3L isotonic fluids. Is now passing flatus therefore is more comfortable now. No other complaints.   Objective:   BP (!) 145/87 (BP Location: Right Arm)   Pulse 95   Temp 99.2 F (37.3 C) (Oral)   Resp 18   Ht 6' (1.829 m)   Wt 136.1 kg   SpO2 96%   BMI 40.69 kg/m   Intake/Output Summary (Last 24 hours) at 06/01/2023 1324 Last data filed at 06/01/2023 1148 Gross per 24 hour  Intake 3051.2 ml  Output 2540 ml  Net 511.2 ml   Weight change:   Physical Exam: Gen: NAD CVS: RRR Resp: CTA B/L, normal wob Abd: slightly distended, nontender Ext: no edema Neuro: awake, alert  Imaging: CT ABDOMEN PELVIS WO CONTRAST  Result Date: 05/31/2023 CLINICAL DATA:  Abdominal pain. Postop kidney failure. Evaluate for pelvic hematoma. Status post robotic assisted radical prostatectomy. EXAM: CT ABDOMEN AND PELVIS WITHOUT CONTRAST  TECHNIQUE: Multidetector CT imaging of the abdomen and pelvis was performed following the standard protocol without IV contrast. RADIATION DOSE REDUCTION: This exam was performed according to the departmental dose-optimization program which includes automated exposure control, adjustment of the mA and/or kV according to patient size and/or use of iterative reconstruction technique. COMPARISON:  MRI abdomen 10/18/2019. FINDINGS: Lower chest: Mild dependent changes in the lung bases. No significant pleural effusion or airspace consolidation. Hepatobiliary: Contour of the liver is slightly irregular and there is hypertrophy of the lateral segment and caudate lobe of liver no focal liver abnormality identified. Gallbladder appears normal. No bile duct dilatation. Pancreas: Unremarkable. No pancreatic ductal dilatation or surrounding inflammatory changes. Spleen: Normal in size without focal abnormality. Adrenals/Urinary Tract: Normal adrenal glands. No nephrolithiasis identified bilaterally. Benign cyst off the lower pole of left kidney measures 3.2 cm. No follow-up imaging recommended. There is bilateral perinephric fat stranding and bilateral pelvocaliectasis. Mild right hydroureter. No obstructing stones identified. The urinary bladder is partially decompressed around a Foley catheter. Stomach/Bowel: Stomach is normal. No pathologic dilatation of the large or small bowel loops to suggest obstruction or ileus. The appendix is visualized and is normal. Scattered colonic diverticula without signs of acute diverticulitis. Vascular/Lymphatic: Normal caliber abdominal aorta. No signs abdominopelvic adenopathy. Reproductive: Postoperative changes from prostatectomy. No significant hematoma identified within the prostate bed. Low-attenuation fluid within the low pelvis and prostate bed measures fluid density. Other: No large hematoma identified. Small  bilateral extraperitoneal low to intermediate density fluid is identified  extending into the retroperitoneal space bilaterally, right greater than left., image 72/2. There is a lower abdominal percutaneous drainage catheter which enters from a left lower quadrant approach and terminates in the right lower quadrant. Pneumoperitoneum is retroperitoneal gas is identified scratch set small amount of gas, predominantly within the extra peritoneal and retroperitoneal space is identified, likely postoperative. Musculoskeletal: No acute or significant osseous findings. Lumbar degenerative disc disease. IMPRESSION: 1. Postoperative changes from prostatectomy. No significant hematoma identified within the prostate bed. Low-attenuation fluid within the low pelvis and prostate bed measures fluid density. 2. Small volume of bilateral extraperitoneal low to intermediate density fluid is identified extending into the retroperitoneal space bilaterally, right greater than left. This is likely within normal limits given recent surgery. No significant hematoma identified. 3. Lower abdominal percutaneous drainage catheter which enters from a left lower quadrant approach and terminates in the right lower quadrant. 4. Bilateral perinephric fat stranding and bilateral pelvocaliectasis. Mild right hydroureter. No obstructing stones identified. 5. Contour of the liver is slightly irregular and there is hypertrophy of the lateral segment and caudate lobe of liver. Findings are equivocal for cirrhosis. Correlate with liver function tests. 6. Scattered colonic diverticula without signs of acute diverticulitis. Electronically Signed   By: Signa Kell M.D.   On: 05/31/2023 06:16    Labs: BMET Recent Labs  Lab 05/30/23 0555 05/31/23 0518 06/01/23 0626  NA 135 132* 139  K 4.4 3.8 3.8  CL 101 105 108  CO2 23 19* 22  GLUCOSE 144* 165* 115*  BUN 30* 33* 32*  CREATININE 2.55* 3.03* 2.99*  CALCIUM 8.7* 8.3* 8.6*   CBC Recent Labs  Lab 05/29/23 1438 05/30/23 0555 05/31/23 0518 06/01/23 0626  WBC   --   --   --  13.5*  NEUTROABS  --   --   --  10.1*  HGB 15.7 16.1 14.7 13.0  HCT 46.8 50.3 45.0 39.7  MCV  --   --   --  93.0  PLT  --   --   --  221    Medications:     Chlorhexidine Gluconate Cloth  6 each Topical Daily   docusate sodium  100 mg Oral BID   insulin aspart  0-15 Units Subcutaneous TID WC   pantoprazole  40 mg Oral Daily   polyethylene glycol  17 g Oral Daily      Anthony Sar, MD [facility] 06/01/2023, 1:24 PM

## 2023-06-01 NOTE — Progress Notes (Signed)
3 Days Post-Op Subjective: Feels well this AM. Has mild right lateral incision port site pain and low back pain, thinks musculoskeletal and exacerbated by lying in pain. Resolved with ambulation. No nausea or emesis. No flatus or BM. Tolerating diet. Afebrile. Excellent UOP, urine amber.   Objective: Vital signs in last 24 hours: Temp:  [99 F (37.2 C)-100 F (37.8 C)] 99.2 F (37.3 C) (11/25 0433) Pulse Rate:  [95-101] 95 (11/25 0433) Resp:  [14-24] 18 (11/25 0433) BP: (145-162)/(87-98) 145/87 (11/25 0433) SpO2:  [93 %-96 %] 96 % (11/25 0433)  Intake/Output from previous day: 11/24 0701 - 11/25 0700 In: 3251.2 [P.O.:200; I.V.:756.5; IV Piggyback:2294.7] Out: 2850 [Urine:2800; Drains:50] Intake/Output this shift: No intake/output data recorded. UOP: 2.8L amber JP: 50ml thin ss  Physical Exam:  General: Alert and oriented CV: RRR Lungs: Clear Abdomen: Morbidly obese with large pannus, soft, ND, ATTP; inc c/d/I; No CVA tenderness Ext: NT, No erythema; back and LE soft without tenderness  Lab Results: Recent Labs    05/30/23 0555 05/31/23 0518 06/01/23 0626  HGB 16.1 14.7 13.0  HCT 50.3 45.0 39.7   BMET Recent Labs    05/31/23 0518 06/01/23 0626  NA 132* 139  K 3.8 3.8  CL 105 108  CO2 19* 22  GLUCOSE 165* 115*  BUN 33* 32*  CREATININE 3.03* 2.99*  CALCIUM 8.3* 8.6*     Studies/Results: CT ABDOMEN PELVIS WO CONTRAST  Result Date: 05/31/2023 CLINICAL DATA:  Abdominal pain. Postop kidney failure. Evaluate for pelvic hematoma. Status post robotic assisted radical prostatectomy. EXAM: CT ABDOMEN AND PELVIS WITHOUT CONTRAST TECHNIQUE: Multidetector CT imaging of the abdomen and pelvis was performed following the standard protocol without IV contrast. RADIATION DOSE REDUCTION: This exam was performed according to the departmental dose-optimization program which includes automated exposure control, adjustment of the mA and/or kV according to patient size and/or use  of iterative reconstruction technique. COMPARISON:  MRI abdomen 10/18/2019. FINDINGS: Lower chest: Mild dependent changes in the lung bases. No significant pleural effusion or airspace consolidation. Hepatobiliary: Contour of the liver is slightly irregular and there is hypertrophy of the lateral segment and caudate lobe of liver no focal liver abnormality identified. Gallbladder appears normal. No bile duct dilatation. Pancreas: Unremarkable. No pancreatic ductal dilatation or surrounding inflammatory changes. Spleen: Normal in size without focal abnormality. Adrenals/Urinary Tract: Normal adrenal glands. No nephrolithiasis identified bilaterally. Benign cyst off the lower pole of left kidney measures 3.2 cm. No follow-up imaging recommended. There is bilateral perinephric fat stranding and bilateral pelvocaliectasis. Mild right hydroureter. No obstructing stones identified. The urinary bladder is partially decompressed around a Foley catheter. Stomach/Bowel: Stomach is normal. No pathologic dilatation of the large or small bowel loops to suggest obstruction or ileus. The appendix is visualized and is normal. Scattered colonic diverticula without signs of acute diverticulitis. Vascular/Lymphatic: Normal caliber abdominal aorta. No signs abdominopelvic adenopathy. Reproductive: Postoperative changes from prostatectomy. No significant hematoma identified within the prostate bed. Low-attenuation fluid within the low pelvis and prostate bed measures fluid density. Other: No large hematoma identified. Small bilateral extraperitoneal low to intermediate density fluid is identified extending into the retroperitoneal space bilaterally, right greater than left., image 72/2. There is a lower abdominal percutaneous drainage catheter which enters from a left lower quadrant approach and terminates in the right lower quadrant. Pneumoperitoneum is retroperitoneal gas is identified scratch set small amount of gas, predominantly  within the extra peritoneal and retroperitoneal space is identified, likely postoperative. Musculoskeletal: No acute or significant osseous findings.  Lumbar degenerative disc disease. IMPRESSION: 1. Postoperative changes from prostatectomy. No significant hematoma identified within the prostate bed. Low-attenuation fluid within the low pelvis and prostate bed measures fluid density. 2. Small volume of bilateral extraperitoneal low to intermediate density fluid is identified extending into the retroperitoneal space bilaterally, right greater than left. This is likely within normal limits given recent surgery. No significant hematoma identified. 3. Lower abdominal percutaneous drainage catheter which enters from a left lower quadrant approach and terminates in the right lower quadrant. 4. Bilateral perinephric fat stranding and bilateral pelvocaliectasis. Mild right hydroureter. No obstructing stones identified. 5. Contour of the liver is slightly irregular and there is hypertrophy of the lateral segment and caudate lobe of liver. Findings are equivocal for cirrhosis. Correlate with liver function tests. 6. Scattered colonic diverticula without signs of acute diverticulitis. Electronically Signed   By: Signa Kell M.D.   On: 05/31/2023 06:16    Assessment/Plan: Prostate cancer s/p RALP with PLND 05/29/2023 AKI  -Pain control prn -Diet as tolerated -Creatinine still elevated at 2.99 from 3.03 despite 3L LR and hydration overnight. Excellent UOP 2.8L. -Pt without flank pain and CT reviewed with expected right distal mild hydroureter given large ureterocele intraoperatively. This was effluxing copious urine during case. Further no sign of leak as JP creatinine seroequivalent and no large urinoma on CT imaging. No sign of rhabdo with benign exam and CK appropriately normal range. Suspect intrinsic renal disease. -Appreciate nephrology assistance   LOS: 2 days   Matt R. Cheyann Blecha MD 06/01/2023, 9:26  AM [facility]  Pager: [phone]

## 2023-06-02 LAB — GLUCOSE, CAPILLARY
Glucose-Capillary: 113 mg/dL — ABNORMAL HIGH (ref 70–99)
Glucose-Capillary: 108 mg/dL — ABNORMAL HIGH (ref 70–99)

## 2023-06-02 LAB — BASIC METABOLIC PANEL
Anion gap: 6 (ref 5–15)
BUN: 26 mg/dL — ABNORMAL HIGH (ref 6–20)
CO2: 24 mmol/L (ref 22–32)
Calcium: 8.4 mg/dL — ABNORMAL LOW (ref 8.9–10.3)
Chloride: 108 mmol/L (ref 98–111)
Creatinine, Ser: 1.86 mg/dL — ABNORMAL HIGH (ref 0.61–1.24)
GFR, Estimated: 41 mL/min — ABNORMAL LOW (ref >60)
Glucose, Bld: 134 mg/dL — ABNORMAL HIGH (ref 70–99)
Potassium: 3.5 mmol/L (ref 3.5–5.1)
Sodium: 138 mmol/L (ref 135–145)

## 2023-06-02 LAB — CBC WITH DIFFERENTIAL/PLATELET
Abs Immature Granulocytes: 0.06 10*3/uL (ref 0.00–0.07)
Basophils Absolute: 0.1 10*3/uL (ref 0.0–0.1)
Basophils Relative: 1 %
Eosinophils Absolute: 0.1 10*3/uL (ref 0.0–0.5)
Eosinophils Relative: 1 %
HCT: 40.9 % (ref 39.0–52.0)
Hemoglobin: 13.4 g/dL (ref 13.0–17.0)
Immature Granulocytes: 1 %
Lymphocytes Relative: 13 %
Lymphs Abs: 1.4 10*3/uL (ref 0.7–4.0)
MCH: 30.9 pg (ref 26.0–34.0)
MCHC: 32.8 g/dL (ref 30.0–36.0)
MCV: 94.5 fL (ref 80.0–100.0)
Monocytes Absolute: 1.6 10*3/uL — ABNORMAL HIGH (ref 0.1–1.0)
Monocytes Relative: 14 %
Neutro Abs: 7.7 10*3/uL (ref 1.7–7.7)
Neutrophils Relative %: 70 %
Platelets: 259 10*3/uL (ref 150–400)
RBC: 4.33 MIL/uL (ref 4.22–5.81)
RDW: 13.2 % (ref 11.5–15.5)
WBC: 10.9 10*3/uL — ABNORMAL HIGH (ref 4.0–10.5)
nRBC: 0 % (ref 0.0–0.2)

## 2023-06-02 LAB — SURGICAL PATHOLOGY

## 2023-06-02 NOTE — Clinical Social Work Note (Addendum)
Transition of Care [facility]) - Inpatient Brief Assessment   Patient Details  Name: Darrell Whitehead MRN: [id number] Date of Birth: [birth year]  Transition of Care [facility]) CM/SW Contact:    Darleene Cleaver, LCSW Phone Number: 06/02/2023, 1:22 PM   Clinical Narrative:  Patient has been reviewed, no SDOH needs.  Transition of Care Asessment: Insurance and Status: Insurance coverage has been reviewed Patient has primary care physician: Yes Home environment has been reviewed: Yes Prior level of function:: Indep Prior/Current Home Services: No current home services Social Determinants of Health Reivew: SDOH reviewed no interventions necessary Readmission risk has been reviewed: Yes Transition of care needs: no transition of care needs at this time

## 2023-06-02 NOTE — Discharge Summary (Signed)
Date of admission: 05/29/2023  Date of discharge: 06/02/2023  Admission diagnosis: Prostate cancer  Discharge diagnosis: Prostate cancer  Secondary diagnoses: AKI  History and Physical: For full details, please see admission history and physical. Briefly, Darrell Whitehead is a 57 y.o. year old patient with with prostate cancer who underwent RALP with b/l PLND on 05/29/2023.   Hospital Course: The patient recovered in the usual expected fashion.  He had his diet advanced slowly.  Initially managed with IV pain control, then transitioned to PO meds when he was tolerating oral intake.  He was found to have an AKI and nephrology was consulted. This was felt to be due to pre-renal insults and he responded well with fluid boluses. He had excellent urine output and creatinine improved. His JP creatinine was seroequivalent and removed prior to discharge. He was discharged to home on POD#4.  At the time of discharge the patient was tolerating a regular diet, passing flatus, ambulating, had adequate pain control and was agreeable to discharge.  Follow up as scheduled.    Laboratory values:  Recent Labs    05/31/23 0518 06/01/23 0626 06/02/23 0446  HGB 14.7 13.0 13.4  HCT 45.0 39.7 40.9   Recent Labs    06/01/23 0626 06/02/23 0446  CREATININE 2.99* 1.86*    Disposition: Home  Discharge instruction: The patient was instructed to be ambulatory but told to refrain from heavy lifting, strenuous activity, or driving.   Discharge medications:     Followup:   Follow-up Information     Jannifer Hick, MD Follow up on 06/08/2023.   Specialty: Urology Why: at 8:15 Contact information: [address] [place] [address] [phone]                 Dineen Kid. Gwendalynn Eckstrom MD [facility]  Pager: [phone]

## 2023-06-02 NOTE — Progress Notes (Signed)
Mobility Specialist - Progress Note   06/02/23 0950  Mobility  Activity Ambulated with assistance in hallway  Level of Assistance Modified independent, requires aide device or extra time  Assistive Device Front wheel walker  Distance Ambulated (ft) 1000 ft  Range of Motion/Exercises Active  Activity Response Tolerated well  Mobility Referral Yes  $Mobility charge 1 Mobility  Mobility Specialist Start Time (ACUTE ONLY) 0930  Mobility Specialist Stop Time (ACUTE ONLY) 0950  Mobility Specialist Time Calculation (min) (ACUTE ONLY) 20 min   Pt was found in bed and wanting to ambulate. No complaints with session. At EOS returned to sit EOB with call bell in reach and NT in room.  Billey Chang Mobility Specialist

## 2023-06-02 NOTE — Progress Notes (Signed)
Patient to be discharged to home today. All Discharge instructions reviewed with the Patient and Patient's Wife. All discharge Medications and schedules for these Medications reviewed with the Patient and the Patient's Wife. Home Care post op Prostatectomy instructions reviewed with the Patient and Patient's Wife. Patient and Patient's verbalized understanding of foley and leg bag care. Discharge AVS with the Patient at time of discharge and understanding verbalized of all discharge instructions

## 2023-06-02 NOTE — Plan of Care (Signed)
  Problem: Pain Management: Goal: General experience of comfort will improve Outcome: Progressing   Problem: Education: Goal: Knowledge of General Education information will improve Description: Including pain rating scale, medication(s)/side effects and non-pharmacologic comfort measures Outcome: Progressing   Problem: Clinical Measurements: Goal: Will remain free from infection Outcome: Progressing   Problem: Nutritional: Goal: Maintenance of adequate nutrition will improve Outcome: Progressing

## 2023-06-02 NOTE — Progress Notes (Signed)
  [facility] Progress Note    Assessment/ Plan:   AKI - no hx of CKD but does follow with Dr. Ronalee Belts for proteinuria (is due for follow up in 2025). Baseline creat 0.86 from 11/15 pre-op labs. Creat here post -op D#1 was up to 2.55 and then up to 3.0, in the setting of recent prostatectomy done here on 11/22. UOP is decent. CT abd showing mild R hydroureter, o/w normal appearing kidneys.. Urine sediment was benign w/ sheets of monomorphic rbc's and less so wbc's.  Suspecting his AKI is secondary to prerenal and hemodynamic insults from surgery and then concominant ARB use whilst in AKI. C/w supportive care for now. Cr better-down to 1.86 and has robust UOP. Hopefully will continue to improve. Would recommend holding ARB and Farxiga until he follows up at our office HTN - bp's up a bit. If needed we can give norvasc or hydralazine.  SP robotic assisted lap prostatectomy - on 11/22, per urology  Discussed with primary service. Will plan for follow up at the office in about 1-2 weeks, message sent. Okay for d/c from a nephrology perspective.  Subjective:   Patient seen and examined bedside. Uop ~4.4L. Feels better today, ready to go home. Has not been on IVFs   Objective:   BP (!) 156/95 (BP Location: Right Arm)   Pulse 82   Temp 98.6 F (37 C)   Resp 12   Ht 6' (1.829 m)   Wt (!) 139.3 kg   SpO2 97%   BMI 41.64 kg/m   Intake/Output Summary (Last 24 hours) at 06/02/2023 1226 Last data filed at 06/02/2023 1001 Gross per 24 hour  Intake 1468.67 ml  Output 4865 ml  Net -3396.33 ml   Weight change:   Physical Exam: Gen: NAD CVS: RRR Resp: normal wob Abd: slightly distended, nontender Ext: no sig edema bl LEs Neuro: awake, alert  Imaging: No results found.  Labs: BMET Recent Labs  Lab 05/30/23 0555 05/31/23 0518 06/01/23 0626 06/02/23 0446  NA 135 132* 139 138  K 4.4 3.8 3.8 3.5  CL 101 105 108 108  CO2 23 19* 22 24  GLUCOSE 144* 165* 115* 134*   BUN 30* 33* 32* 26*  CREATININE 2.55* 3.03* 2.99* 1.86*  CALCIUM 8.7* 8.3* 8.6* 8.4*   CBC Recent Labs  Lab 05/30/23 0555 05/31/23 0518 06/01/23 0626 06/02/23 0446  WBC  --   --  13.5* 10.9*  NEUTROABS  --   --  10.1* 7.7  HGB 16.1 14.7 13.0 13.4  HCT 50.3 45.0 39.7 40.9  MCV  --   --  93.0 94.5  PLT  --   --  221 259    Medications:     Chlorhexidine Gluconate Cloth  6 each Topical Daily   docusate sodium  100 mg Oral BID   insulin aspart  0-15 Units Subcutaneous TID WC   pantoprazole  40 mg Oral Daily   polyethylene glycol  17 g Oral Daily      Anthony Sar, MD [facility] 06/02/2023, 12:26 PM
